# Patient Record
Sex: Female | Born: 1980
Health system: Southern US, Community
[De-identification: ages and names within clinical notes are randomized; demographics above are authoritative.]

## PROBLEM LIST (undated history)

## (undated) HISTORY — PX: LAPAROSCOPY: SHX197

## (undated) HISTORY — PX: DILATION AND CURETTAGE OF UTERUS: SHX78

---
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
## 2002-01-10 ENCOUNTER — Encounter: Payer: Self-pay | Admitting: Family Medicine

## 2002-01-10 ENCOUNTER — Encounter: Admission: RE | Admit: 2002-01-10 | Discharge: 2002-01-10 | Payer: Self-pay | Admitting: Family Medicine

## 2002-05-28 ENCOUNTER — Other Ambulatory Visit: Admission: RE | Admit: 2002-05-28 | Discharge: 2002-05-28 | Payer: Self-pay | Admitting: Obstetrics & Gynecology

## 2002-08-25 ENCOUNTER — Other Ambulatory Visit: Admission: RE | Admit: 2002-08-25 | Discharge: 2002-08-25 | Payer: Self-pay | Admitting: Obstetrics & Gynecology

## 2002-12-12 ENCOUNTER — Encounter (INDEPENDENT_AMBULATORY_CARE_PROVIDER_SITE_OTHER): Payer: Self-pay

## 2002-12-12 ENCOUNTER — Ambulatory Visit (HOSPITAL_COMMUNITY): Admission: RE | Admit: 2002-12-12 | Discharge: 2002-12-12 | Payer: Self-pay | Admitting: Obstetrics & Gynecology

## 2003-05-05 ENCOUNTER — Other Ambulatory Visit: Admission: RE | Admit: 2003-05-05 | Discharge: 2003-05-05 | Payer: Self-pay | Admitting: Obstetrics & Gynecology

## 2003-07-13 ENCOUNTER — Other Ambulatory Visit: Admission: RE | Admit: 2003-07-13 | Discharge: 2003-07-13 | Payer: Self-pay | Admitting: Obstetrics & Gynecology

## 2003-11-24 ENCOUNTER — Other Ambulatory Visit: Admission: RE | Admit: 2003-11-24 | Discharge: 2003-11-24 | Payer: Self-pay | Admitting: Obstetrics & Gynecology

## 2004-04-05 ENCOUNTER — Other Ambulatory Visit: Admission: RE | Admit: 2004-04-05 | Discharge: 2004-04-05 | Payer: Self-pay | Admitting: Obstetrics & Gynecology

## 2004-11-22 ENCOUNTER — Encounter: Admission: RE | Admit: 2004-11-22 | Discharge: 2004-11-22 | Payer: Self-pay | Admitting: Gastroenterology

## 2005-07-25 ENCOUNTER — Other Ambulatory Visit: Admission: RE | Admit: 2005-07-25 | Discharge: 2005-07-25 | Payer: Self-pay | Admitting: Obstetrics and Gynecology

## 2005-10-15 ENCOUNTER — Emergency Department (HOSPITAL_COMMUNITY): Admission: EM | Admit: 2005-10-15 | Discharge: 2005-10-15 | Payer: Self-pay | Admitting: Emergency Medicine

## 2006-12-07 ENCOUNTER — Other Ambulatory Visit: Admission: RE | Admit: 2006-12-07 | Discharge: 2006-12-07 | Payer: Self-pay | Admitting: Obstetrics and Gynecology

## 2007-05-05 ENCOUNTER — Emergency Department (HOSPITAL_COMMUNITY): Admission: EM | Admit: 2007-05-05 | Discharge: 2007-05-05 | Payer: Self-pay | Admitting: Emergency Medicine

## 2007-10-13 ENCOUNTER — Inpatient Hospital Stay (HOSPITAL_COMMUNITY): Admission: AD | Admit: 2007-10-13 | Discharge: 2007-10-13 | Payer: Self-pay | Admitting: Obstetrics and Gynecology

## 2007-12-04 ENCOUNTER — Inpatient Hospital Stay (HOSPITAL_COMMUNITY): Admission: AD | Admit: 2007-12-04 | Discharge: 2007-12-05 | Payer: Self-pay | Admitting: Obstetrics and Gynecology

## 2007-12-05 ENCOUNTER — Encounter (INDEPENDENT_AMBULATORY_CARE_PROVIDER_SITE_OTHER): Payer: Self-pay | Admitting: Obstetrics and Gynecology

## 2007-12-29 ENCOUNTER — Emergency Department (HOSPITAL_COMMUNITY): Admission: EM | Admit: 2007-12-29 | Discharge: 2007-12-29 | Payer: Self-pay | Admitting: Emergency Medicine

## 2008-02-03 ENCOUNTER — Encounter: Admission: RE | Admit: 2008-02-03 | Discharge: 2008-02-03 | Payer: Self-pay | Admitting: Otolaryngology

## 2008-02-17 ENCOUNTER — Other Ambulatory Visit: Admission: RE | Admit: 2008-02-17 | Discharge: 2008-02-17 | Payer: Self-pay | Admitting: Obstetrics and Gynecology

## 2008-06-09 ENCOUNTER — Inpatient Hospital Stay (HOSPITAL_COMMUNITY): Admission: AD | Admit: 2008-06-09 | Discharge: 2008-06-09 | Payer: Self-pay | Admitting: Obstetrics and Gynecology

## 2008-06-29 ENCOUNTER — Ambulatory Visit (HOSPITAL_COMMUNITY): Admission: RE | Admit: 2008-06-29 | Discharge: 2008-06-29 | Payer: Self-pay | Admitting: Obstetrics and Gynecology

## 2008-07-08 ENCOUNTER — Inpatient Hospital Stay (HOSPITAL_COMMUNITY): Admission: AD | Admit: 2008-07-08 | Discharge: 2008-07-09 | Payer: Self-pay | Admitting: Obstetrics and Gynecology

## 2008-07-11 ENCOUNTER — Inpatient Hospital Stay (HOSPITAL_COMMUNITY): Admission: AD | Admit: 2008-07-11 | Discharge: 2008-07-15 | Payer: Self-pay | Admitting: Obstetrics and Gynecology

## 2008-07-13 ENCOUNTER — Encounter (INDEPENDENT_AMBULATORY_CARE_PROVIDER_SITE_OTHER): Payer: Self-pay | Admitting: Obstetrics and Gynecology

## 2008-08-11 ENCOUNTER — Inpatient Hospital Stay (HOSPITAL_COMMUNITY): Admission: AD | Admit: 2008-08-11 | Discharge: 2008-08-11 | Payer: Self-pay | Admitting: Obstetrics and Gynecology

## 2008-08-13 ENCOUNTER — Encounter (INDEPENDENT_AMBULATORY_CARE_PROVIDER_SITE_OTHER): Payer: Self-pay | Admitting: Obstetrics and Gynecology

## 2008-08-13 ENCOUNTER — Ambulatory Visit (HOSPITAL_COMMUNITY): Admission: AD | Admit: 2008-08-13 | Discharge: 2008-08-13 | Payer: Self-pay | Admitting: Obstetrics and Gynecology

## 2009-01-14 ENCOUNTER — Emergency Department (HOSPITAL_COMMUNITY): Admission: EM | Admit: 2009-01-14 | Discharge: 2009-01-14 | Payer: Self-pay | Admitting: Family Medicine

## 2009-01-18 ENCOUNTER — Emergency Department (HOSPITAL_COMMUNITY): Admission: EM | Admit: 2009-01-18 | Discharge: 2009-01-18 | Payer: Self-pay | Admitting: Family Medicine

## 2009-03-27 ENCOUNTER — Emergency Department (HOSPITAL_COMMUNITY): Admission: EM | Admit: 2009-03-27 | Discharge: 2009-03-27 | Payer: Self-pay | Admitting: Emergency Medicine

## 2009-05-05 ENCOUNTER — Other Ambulatory Visit: Admission: RE | Admit: 2009-05-05 | Discharge: 2009-05-05 | Payer: Self-pay | Admitting: Obstetrics and Gynecology

## 2009-05-20 ENCOUNTER — Inpatient Hospital Stay (HOSPITAL_COMMUNITY): Admission: AD | Admit: 2009-05-20 | Discharge: 2009-05-20 | Payer: Self-pay | Admitting: Obstetrics and Gynecology

## 2009-06-03 ENCOUNTER — Ambulatory Visit (HOSPITAL_COMMUNITY): Admission: RE | Admit: 2009-06-03 | Discharge: 2009-06-03 | Payer: Self-pay | Admitting: Obstetrics and Gynecology

## 2009-06-14 ENCOUNTER — Ambulatory Visit: Payer: Self-pay | Admitting: Family

## 2009-06-14 ENCOUNTER — Inpatient Hospital Stay (HOSPITAL_COMMUNITY): Admission: AD | Admit: 2009-06-14 | Discharge: 2009-06-14 | Payer: Self-pay | Admitting: Obstetrics and Gynecology

## 2009-07-01 ENCOUNTER — Ambulatory Visit (HOSPITAL_COMMUNITY): Admission: RE | Admit: 2009-07-01 | Discharge: 2009-07-01 | Payer: Self-pay | Admitting: Obstetrics and Gynecology

## 2009-07-25 ENCOUNTER — Inpatient Hospital Stay (HOSPITAL_COMMUNITY): Admission: AD | Admit: 2009-07-25 | Discharge: 2009-07-25 | Payer: Self-pay | Admitting: Obstetrics and Gynecology

## 2009-07-25 ENCOUNTER — Ambulatory Visit: Payer: Self-pay | Admitting: Advanced Practice Midwife

## 2009-07-27 ENCOUNTER — Ambulatory Visit (HOSPITAL_COMMUNITY): Admission: RE | Admit: 2009-07-27 | Discharge: 2009-07-27 | Payer: Self-pay | Admitting: Obstetrics and Gynecology

## 2009-08-08 ENCOUNTER — Inpatient Hospital Stay (HOSPITAL_COMMUNITY): Admission: AD | Admit: 2009-08-08 | Discharge: 2009-08-08 | Payer: Self-pay | Admitting: Obstetrics and Gynecology

## 2009-08-09 ENCOUNTER — Ambulatory Visit (HOSPITAL_COMMUNITY): Admission: RE | Admit: 2009-08-09 | Discharge: 2009-08-09 | Payer: Self-pay | Admitting: Obstetrics and Gynecology

## 2009-08-23 ENCOUNTER — Ambulatory Visit (HOSPITAL_COMMUNITY): Admission: RE | Admit: 2009-08-23 | Discharge: 2009-08-23 | Payer: Self-pay | Admitting: Obstetrics and Gynecology

## 2009-09-02 ENCOUNTER — Inpatient Hospital Stay (HOSPITAL_COMMUNITY): Admission: AD | Admit: 2009-09-02 | Discharge: 2009-09-03 | Payer: Self-pay | Admitting: Obstetrics and Gynecology

## 2009-09-02 ENCOUNTER — Ambulatory Visit: Payer: Self-pay | Admitting: Family Medicine

## 2009-09-06 ENCOUNTER — Ambulatory Visit (HOSPITAL_COMMUNITY): Admission: RE | Admit: 2009-09-06 | Discharge: 2009-09-06 | Payer: Self-pay | Admitting: Obstetrics and Gynecology

## 2009-09-10 ENCOUNTER — Inpatient Hospital Stay (HOSPITAL_COMMUNITY): Admission: AD | Admit: 2009-09-10 | Discharge: 2009-09-10 | Payer: Self-pay | Admitting: Obstetrics and Gynecology

## 2009-09-11 ENCOUNTER — Inpatient Hospital Stay (HOSPITAL_COMMUNITY): Admission: AD | Admit: 2009-09-11 | Discharge: 2009-09-17 | Payer: Self-pay | Admitting: Obstetrics and Gynecology

## 2009-09-15 ENCOUNTER — Encounter: Payer: Self-pay | Admitting: Obstetrics and Gynecology

## 2009-09-20 ENCOUNTER — Ambulatory Visit (HOSPITAL_COMMUNITY): Admission: RE | Admit: 2009-09-20 | Discharge: 2009-09-20 | Payer: Self-pay | Admitting: Obstetrics and Gynecology

## 2009-09-22 ENCOUNTER — Ambulatory Visit (HOSPITAL_COMMUNITY): Admission: RE | Admit: 2009-09-22 | Discharge: 2009-09-22 | Payer: Self-pay | Admitting: Obstetrics and Gynecology

## 2009-09-29 ENCOUNTER — Ambulatory Visit (HOSPITAL_COMMUNITY): Admission: RE | Admit: 2009-09-29 | Discharge: 2009-09-29 | Payer: Self-pay | Admitting: Obstetrics and Gynecology

## 2009-10-04 ENCOUNTER — Ambulatory Visit (HOSPITAL_COMMUNITY): Admission: RE | Admit: 2009-10-04 | Discharge: 2009-10-04 | Payer: Self-pay | Admitting: Obstetrics and Gynecology

## 2009-10-06 ENCOUNTER — Ambulatory Visit (HOSPITAL_COMMUNITY): Admission: RE | Admit: 2009-10-06 | Discharge: 2009-10-06 | Payer: Self-pay | Admitting: Obstetrics and Gynecology

## 2009-10-13 ENCOUNTER — Inpatient Hospital Stay (HOSPITAL_COMMUNITY): Admission: AD | Admit: 2009-10-13 | Discharge: 2009-10-13 | Payer: Self-pay | Admitting: Obstetrics and Gynecology

## 2009-10-18 ENCOUNTER — Ambulatory Visit (HOSPITAL_COMMUNITY): Admission: RE | Admit: 2009-10-18 | Discharge: 2009-10-18 | Payer: Self-pay | Admitting: Obstetrics and Gynecology

## 2009-10-20 ENCOUNTER — Inpatient Hospital Stay (HOSPITAL_COMMUNITY): Admission: AD | Admit: 2009-10-20 | Discharge: 2009-10-20 | Payer: Self-pay | Admitting: Obstetrics and Gynecology

## 2009-10-27 ENCOUNTER — Ambulatory Visit (HOSPITAL_COMMUNITY): Admission: RE | Admit: 2009-10-27 | Discharge: 2009-10-27 | Payer: Self-pay | Admitting: Obstetrics and Gynecology

## 2009-11-01 ENCOUNTER — Ambulatory Visit (HOSPITAL_COMMUNITY): Admission: RE | Admit: 2009-11-01 | Discharge: 2009-11-01 | Payer: Self-pay | Admitting: Obstetrics and Gynecology

## 2009-11-03 ENCOUNTER — Inpatient Hospital Stay (HOSPITAL_COMMUNITY): Admission: AD | Admit: 2009-11-03 | Discharge: 2009-11-03 | Payer: Self-pay | Admitting: Obstetrics and Gynecology

## 2009-11-10 ENCOUNTER — Ambulatory Visit (HOSPITAL_COMMUNITY): Admission: AD | Admit: 2009-11-10 | Discharge: 2009-11-10 | Payer: Self-pay | Admitting: Obstetrics and Gynecology

## 2009-11-17 ENCOUNTER — Ambulatory Visit (HOSPITAL_COMMUNITY): Admission: RE | Admit: 2009-11-17 | Discharge: 2009-11-17 | Payer: Self-pay | Admitting: Obstetrics and Gynecology

## 2009-11-22 ENCOUNTER — Ambulatory Visit (HOSPITAL_COMMUNITY): Admission: AD | Admit: 2009-11-22 | Discharge: 2009-11-22 | Payer: Self-pay | Admitting: Obstetrics and Gynecology

## 2009-11-22 ENCOUNTER — Ambulatory Visit: Payer: Self-pay | Admitting: Obstetrics and Gynecology

## 2009-11-24 ENCOUNTER — Ambulatory Visit (HOSPITAL_COMMUNITY): Admission: RE | Admit: 2009-11-24 | Discharge: 2009-11-24 | Payer: Self-pay | Admitting: Obstetrics and Gynecology

## 2009-12-01 ENCOUNTER — Ambulatory Visit (HOSPITAL_COMMUNITY): Admission: RE | Admit: 2009-12-01 | Discharge: 2009-12-01 | Payer: Self-pay | Admitting: Obstetrics and Gynecology

## 2009-12-19 ENCOUNTER — Inpatient Hospital Stay (HOSPITAL_COMMUNITY): Admission: AD | Admit: 2009-12-19 | Discharge: 2009-12-19 | Payer: Self-pay | Admitting: Obstetrics and Gynecology

## 2010-01-03 ENCOUNTER — Inpatient Hospital Stay (HOSPITAL_COMMUNITY): Admission: RE | Admit: 2010-01-03 | Discharge: 2010-01-06 | Payer: Self-pay | Admitting: Obstetrics and Gynecology

## 2010-01-03 ENCOUNTER — Encounter (INDEPENDENT_AMBULATORY_CARE_PROVIDER_SITE_OTHER): Payer: Self-pay | Admitting: Obstetrics and Gynecology

## 2010-03-20 IMAGING — US US OB COMP LESS 14 WK
1 series · 12 of 12 positions shown · non-contrast
Comparison: none

OBSTETRICAL ULTRASOUND:
 This ultrasound was performed in The [HOSPITAL], and the [facility] report will be stored to [REDACTED] PACS.

[Series 1: us ob comp less 14 wk · 12 of 12 slices shown]
[im 1/12]
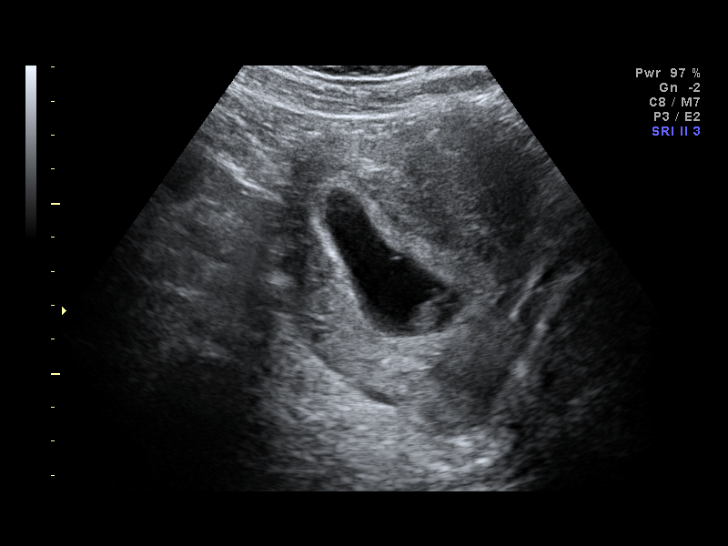
[im 2/12]
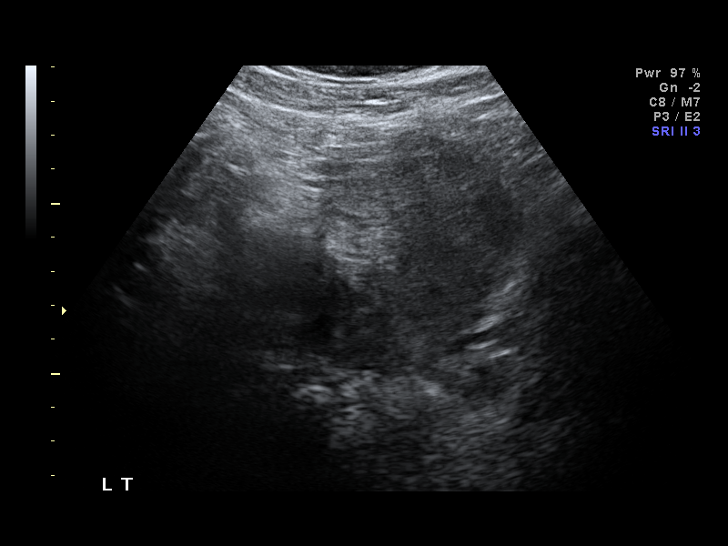
[im 3/12]
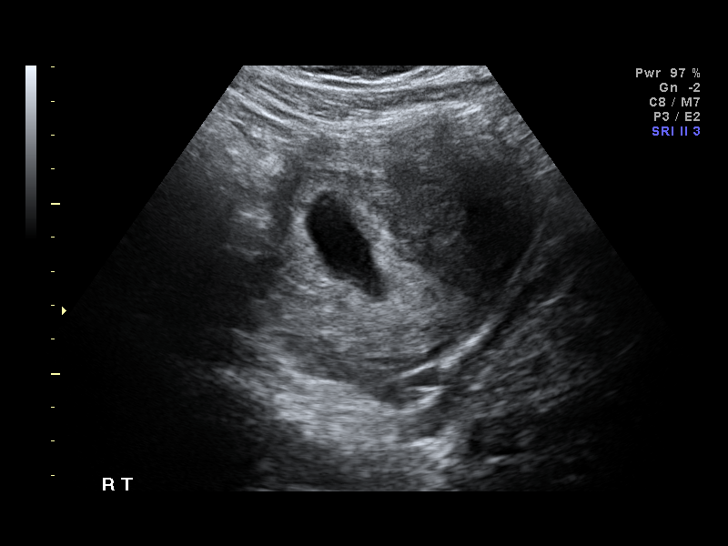
[im 4/12]
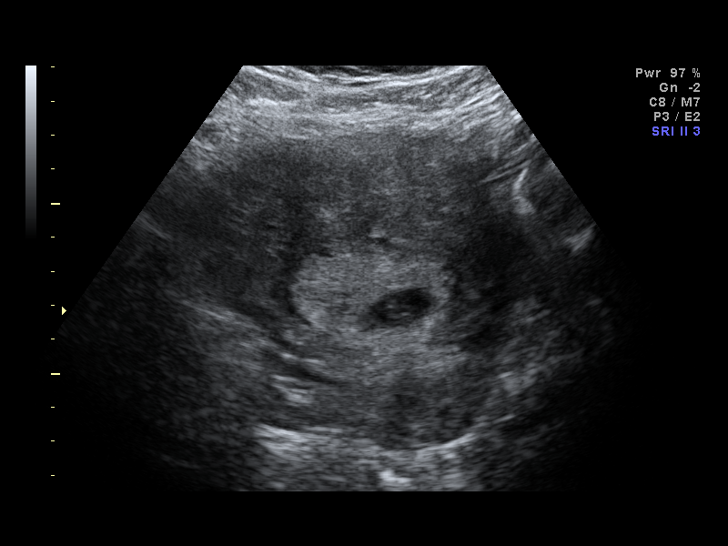
[im 5/12]
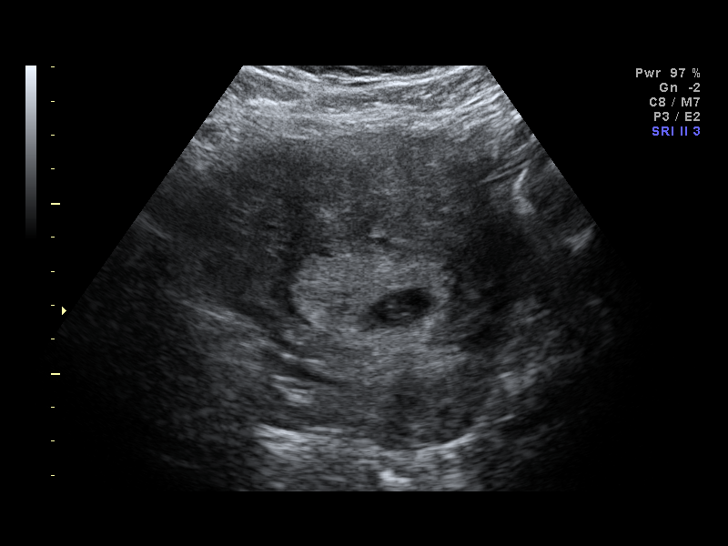
[im 6/12]
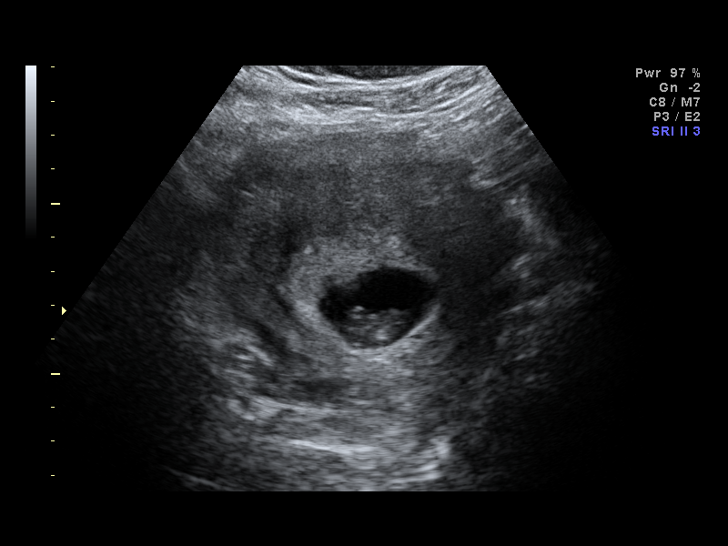
[im 7/12]
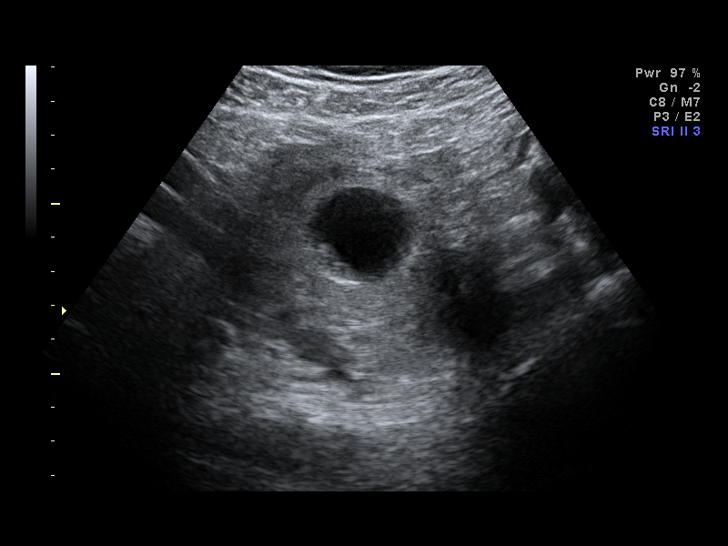
[im 8/12]
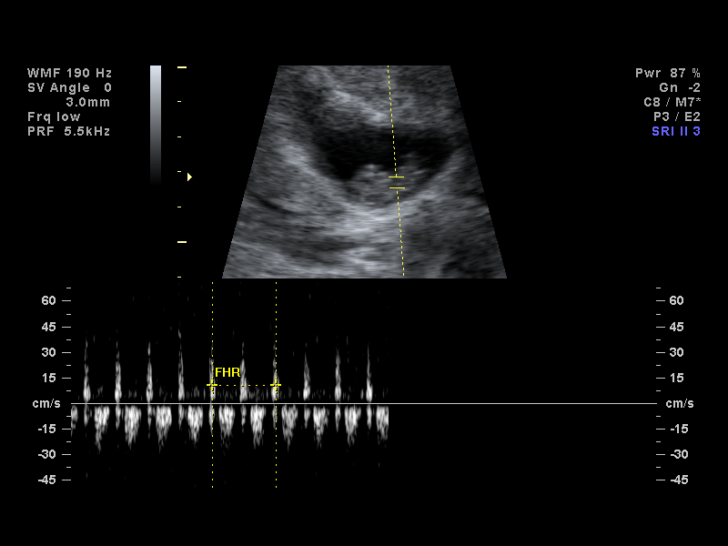
[im 9/12]
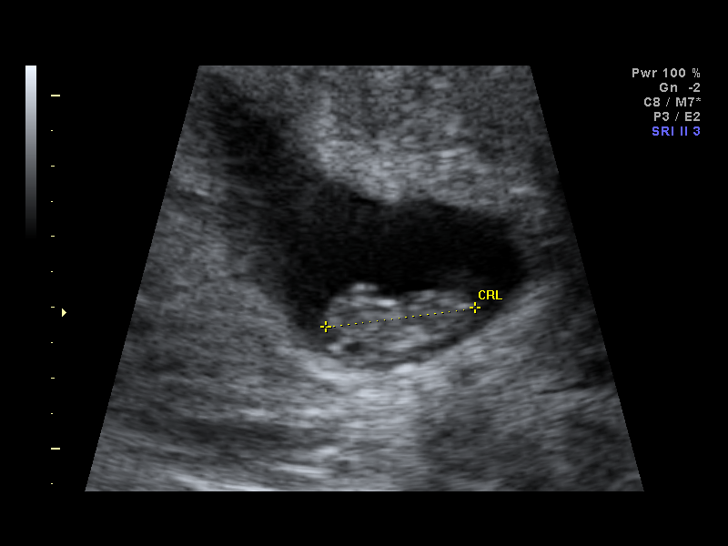
[im 10/12]
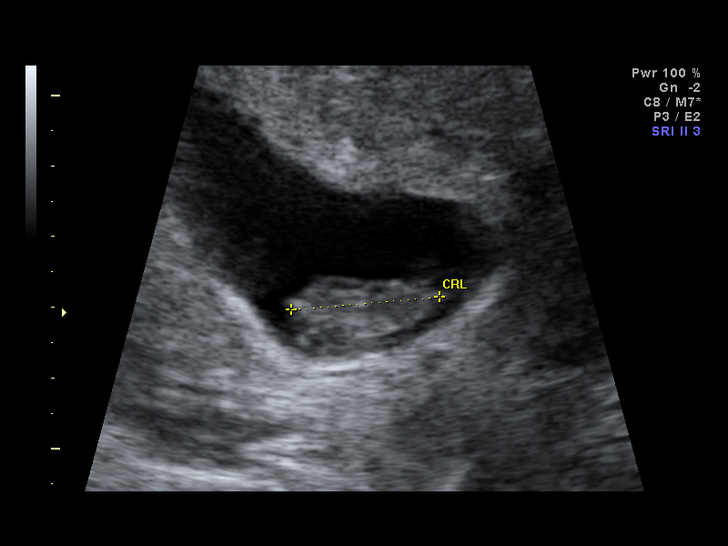
[im 11/12]
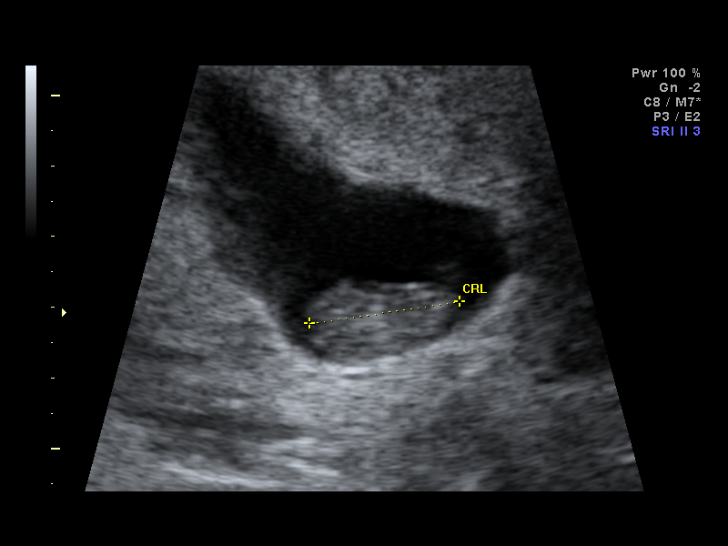
[im 12/12]
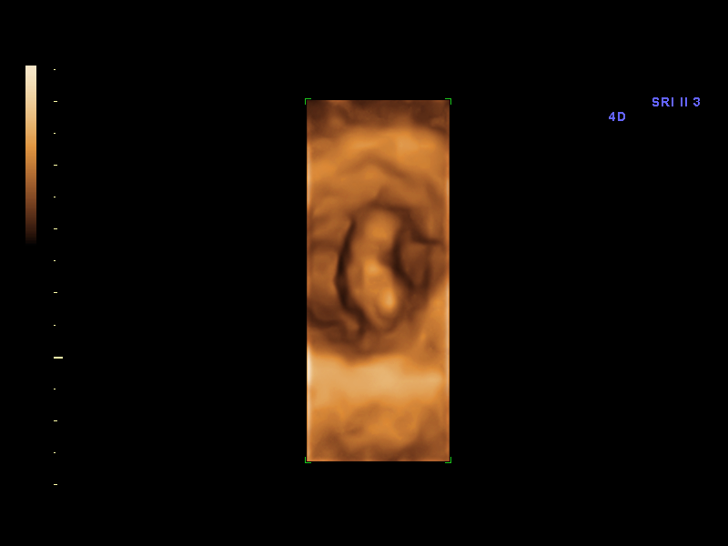

[12 of 12 positions shown; findings below may reference images not displayed]

IMPRESSION: [facility] has also been faxed to the ordering physician.

## 2010-04-17 IMAGING — US US OB COMP LESS 14 WK
1 series · 14 of 28 positions shown · non-contrast
Comparison: none

OBSTETRICAL ULTRASOUND:
 This ultrasound was performed in The [HOSPITAL], and the [facility] report will be stored to [REDACTED] PACS.

[Series 1: us ob comp less 14 wk · 14 of 32 slices shown]
[im 2/32]
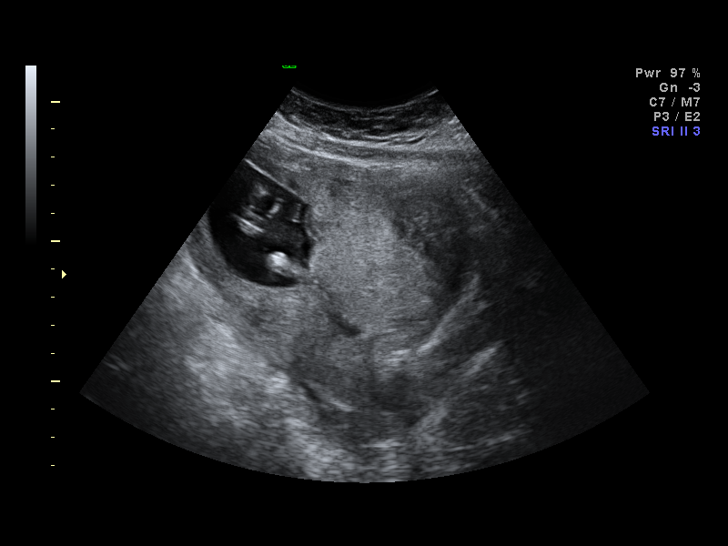
[im 4/32]
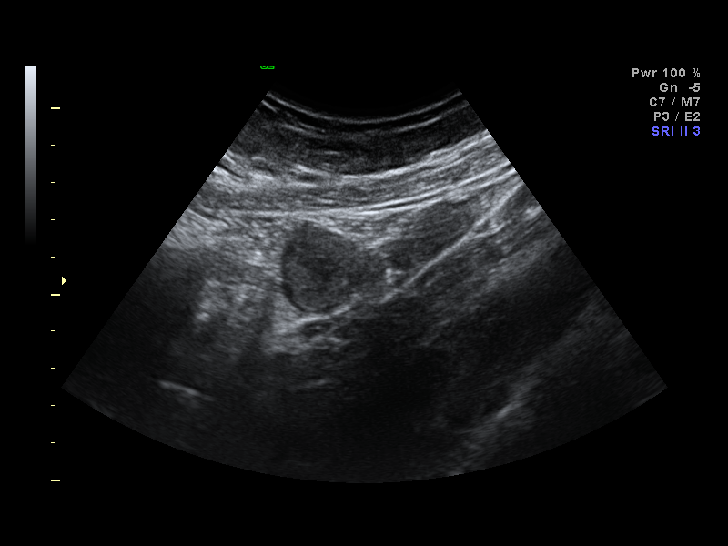
[im 6/32]
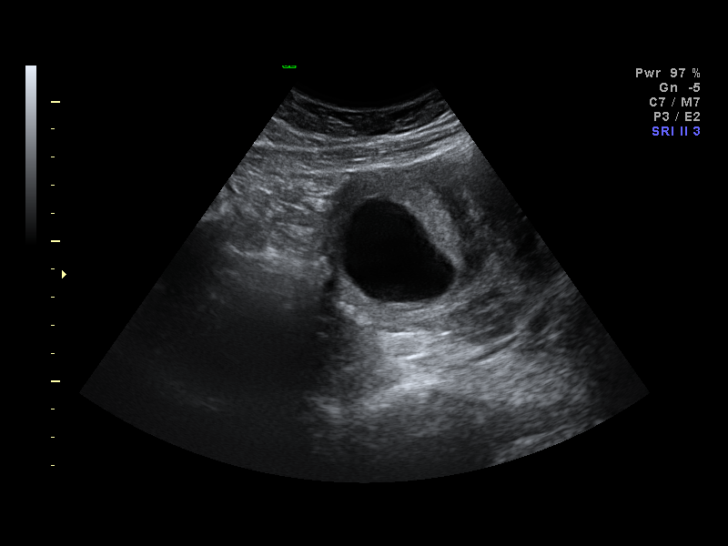
[im 9/32]
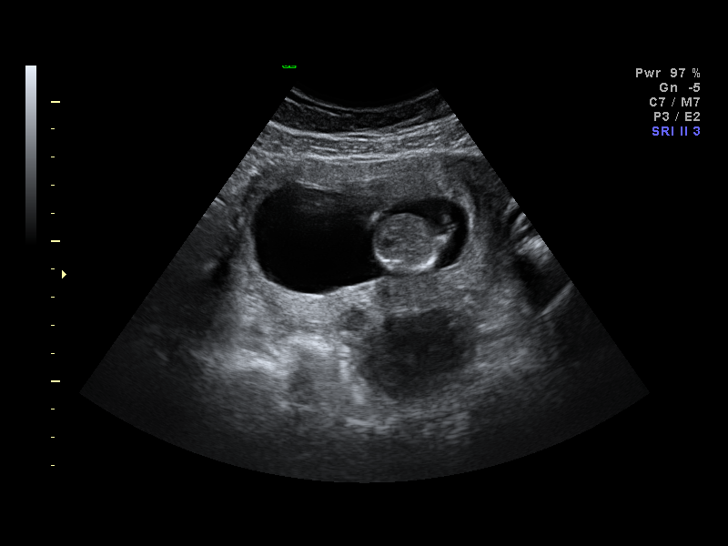
[im 11/32]
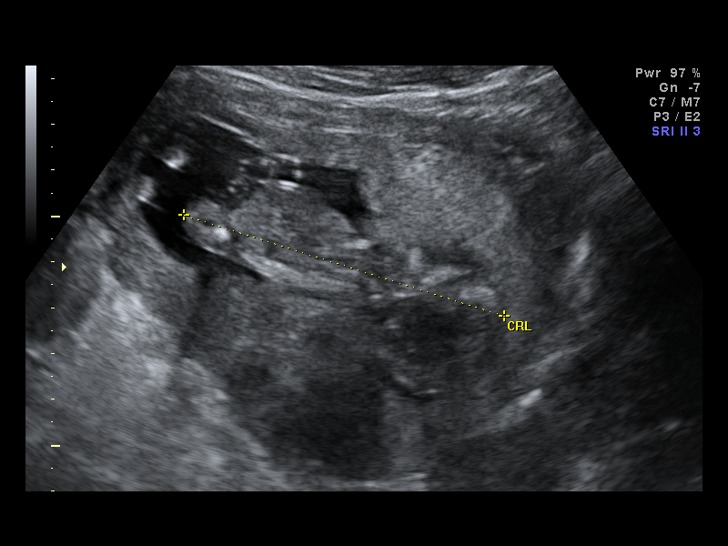
[im 13/32]
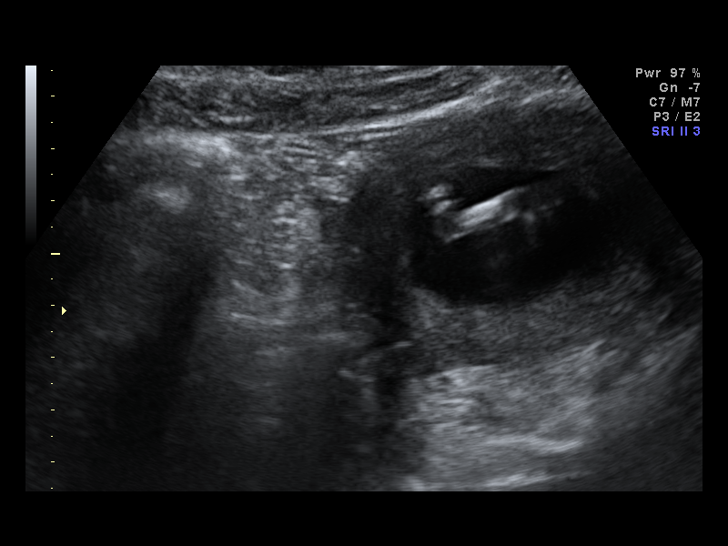
[im 15/32]
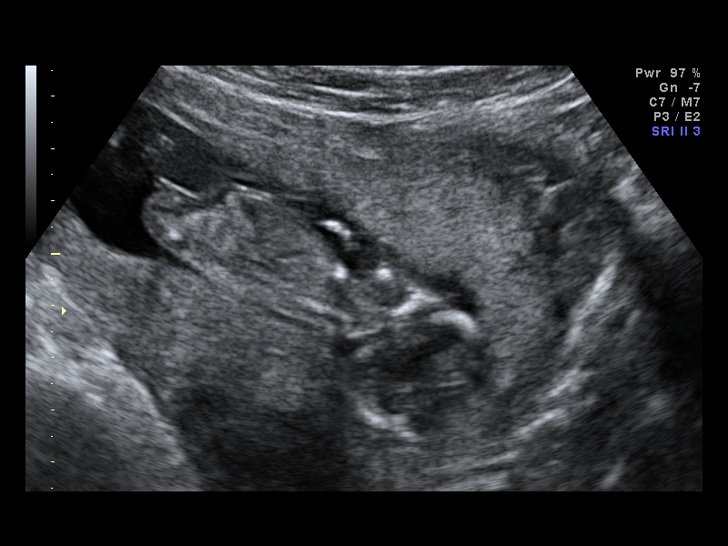
[im 18/32]
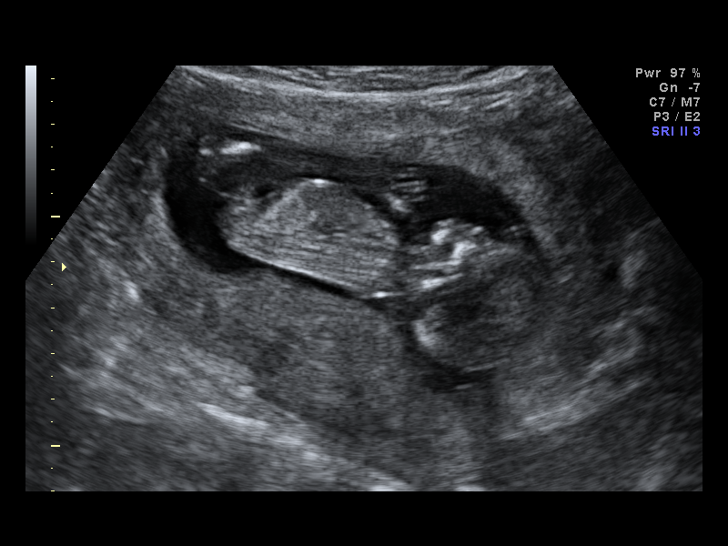
[im 20/32]
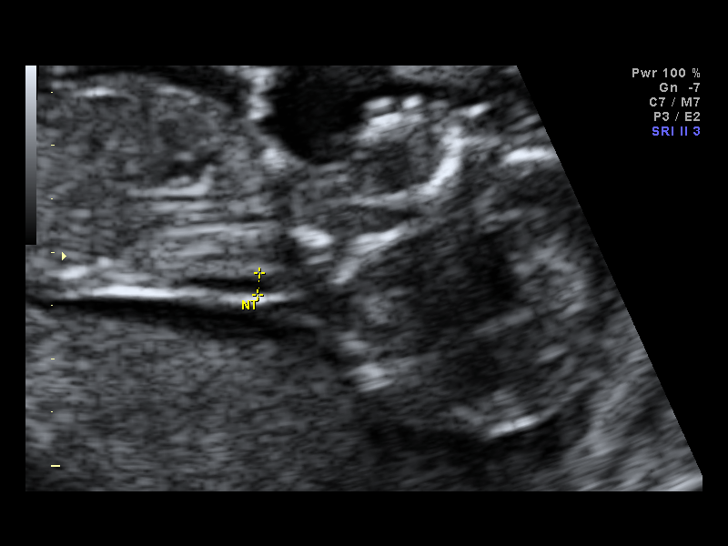
[im 22/32]
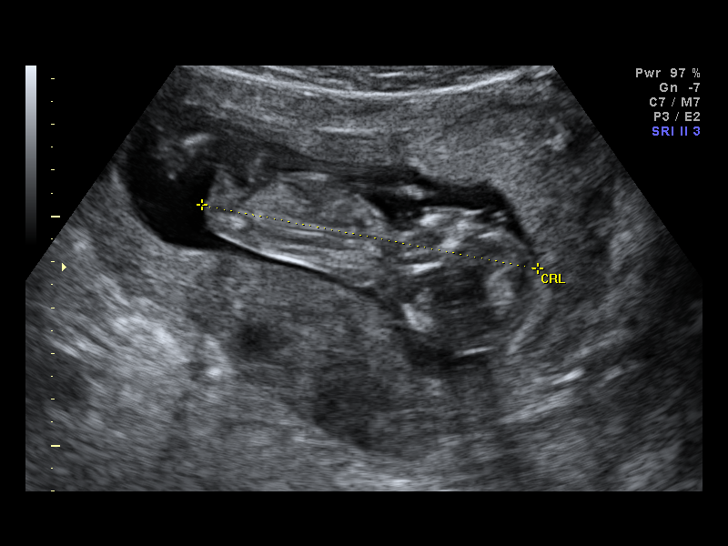
[im 25/32]
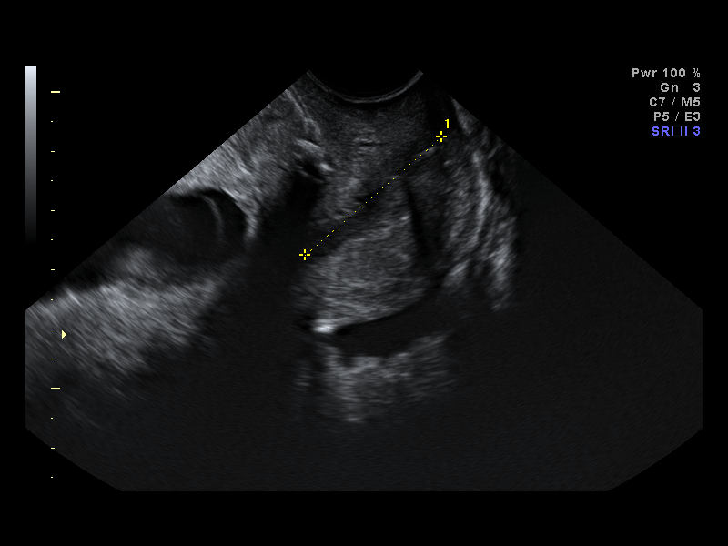
[im 27/32]
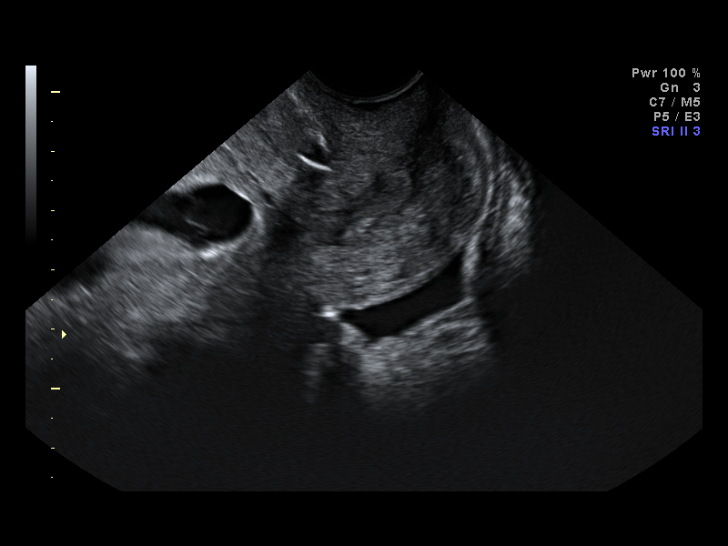
[im 29/32]
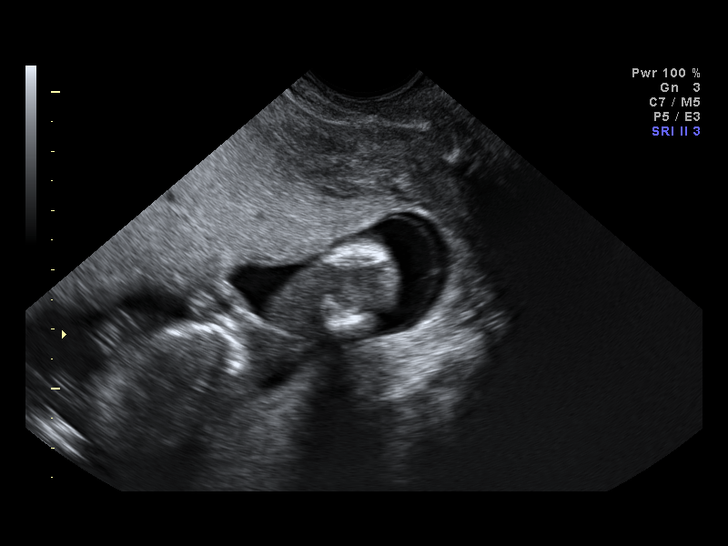
[im 32/32]
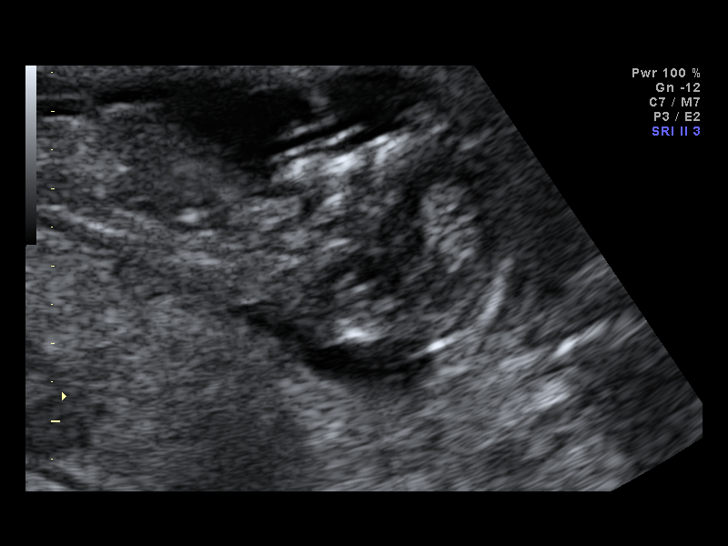

[14 of 28 positions shown; findings below may reference images not displayed]

IMPRESSION: [facility] has also been faxed to the ordering physician.

## 2010-08-04 IMAGING — US US OB TRANSVAGINAL
1 series · 15 of 15 positions shown · non-contrast
Comparison: none

OBSTETRICAL ULTRASOUND:
 This ultrasound was performed in The [[facility]OSP[facility]], and the [facility] report will be stored to [REDACTED] PACS.  This report is also available in [HOSPITAL]?s accessANYware.

[Series 1: us ob transvaginal · 15 of 15 slices shown]
[im 1/15]
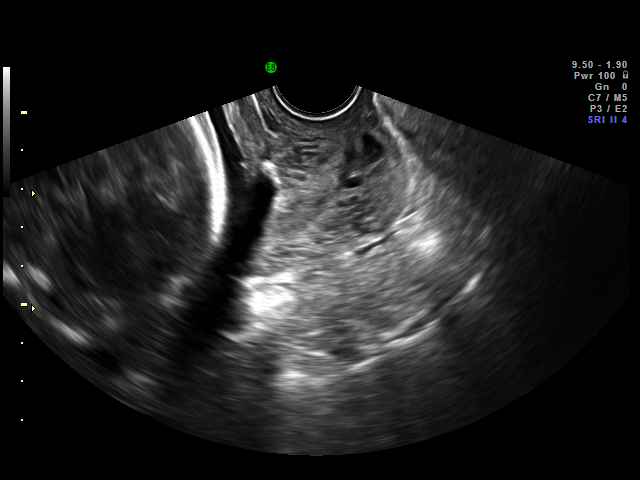
[im 2/15]
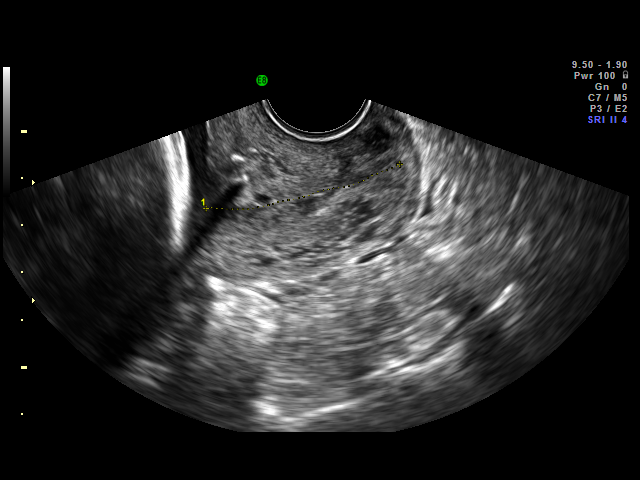
[im 3/15]
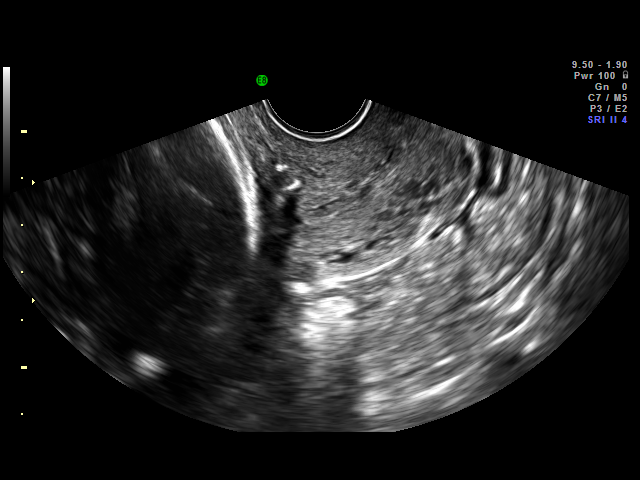
[im 4/15]
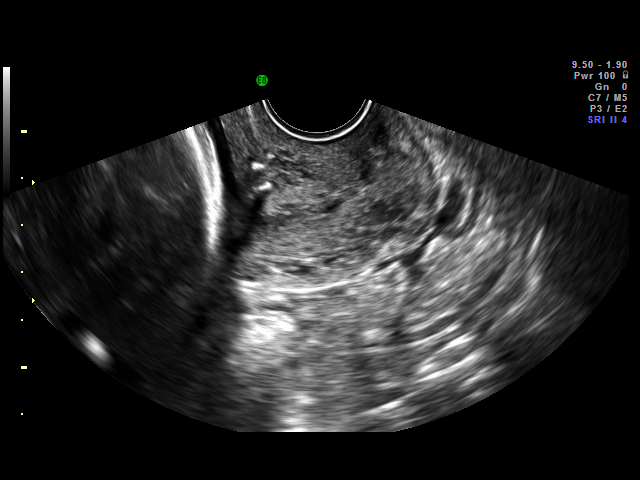
[im 5/15]
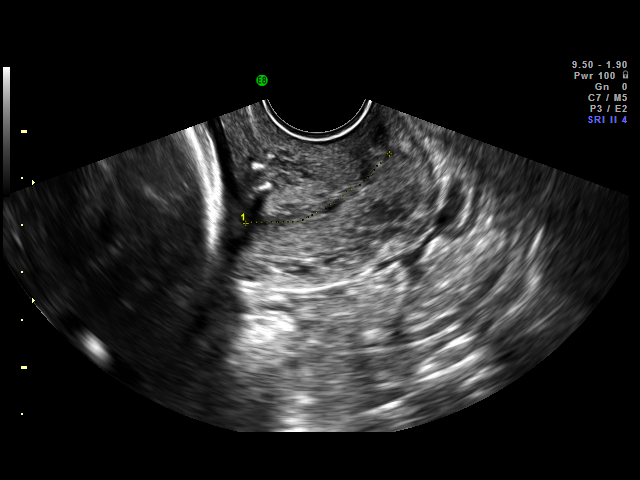
[im 6/15]
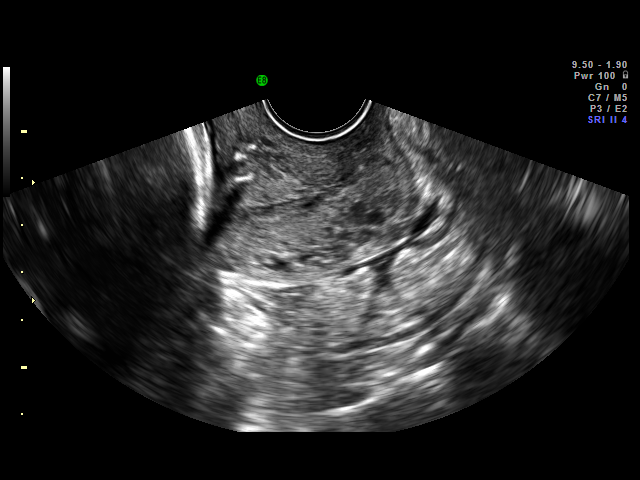
[im 7/15]
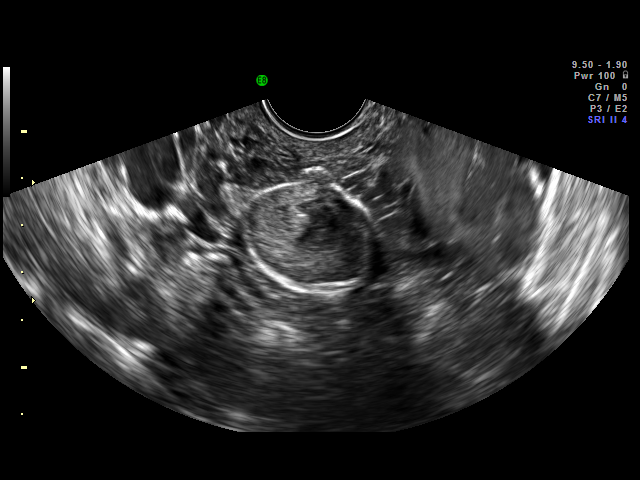
[im 8/15]
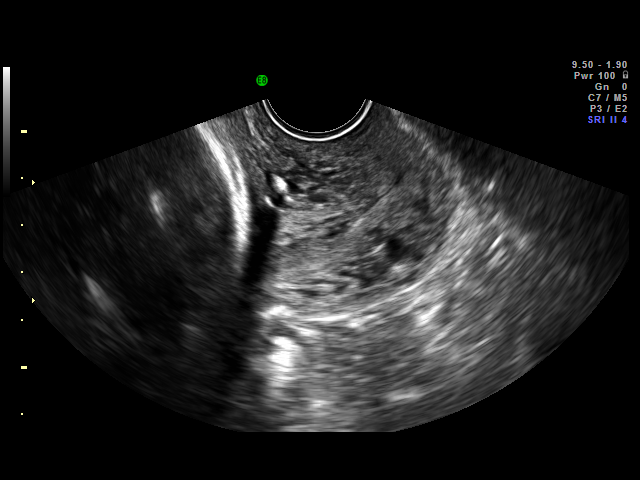
[im 9/15]
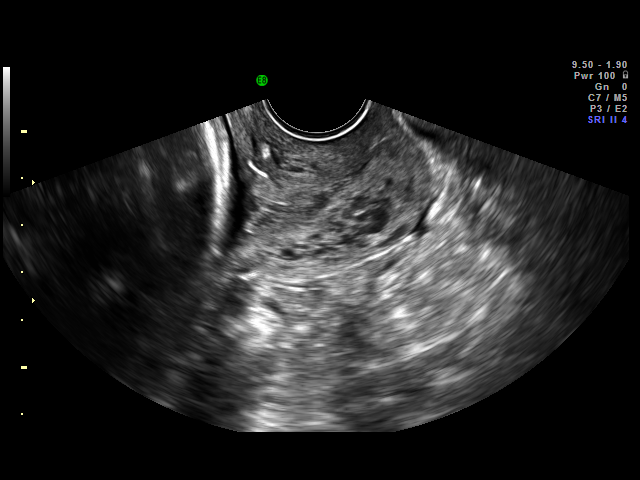
[im 10/15]
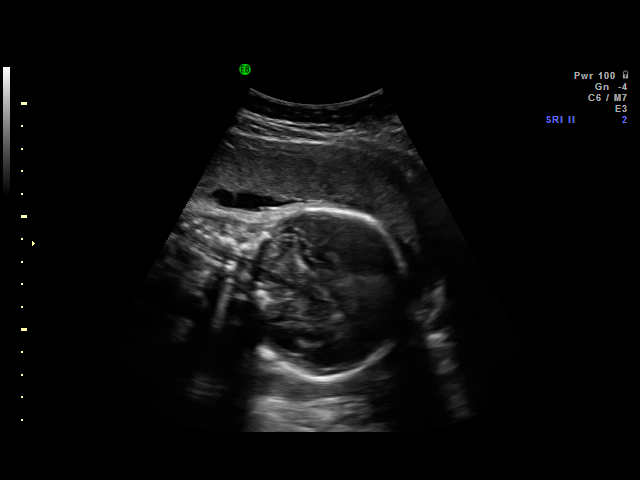
[im 11/15]
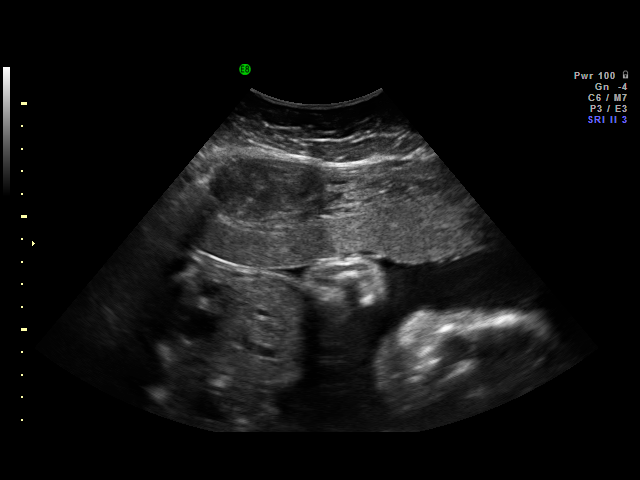
[im 12/15]
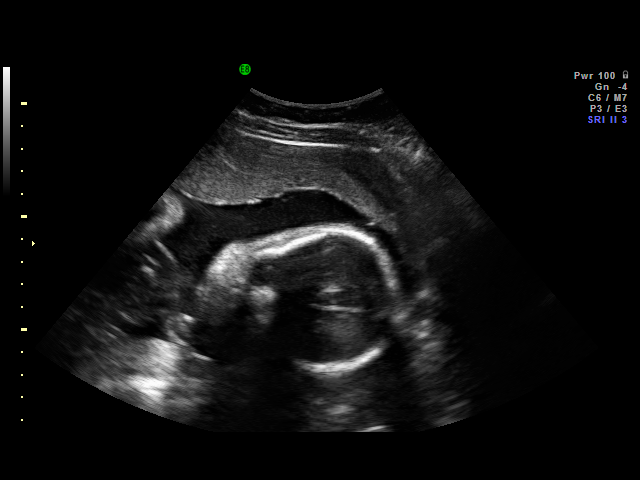
[im 13/15]
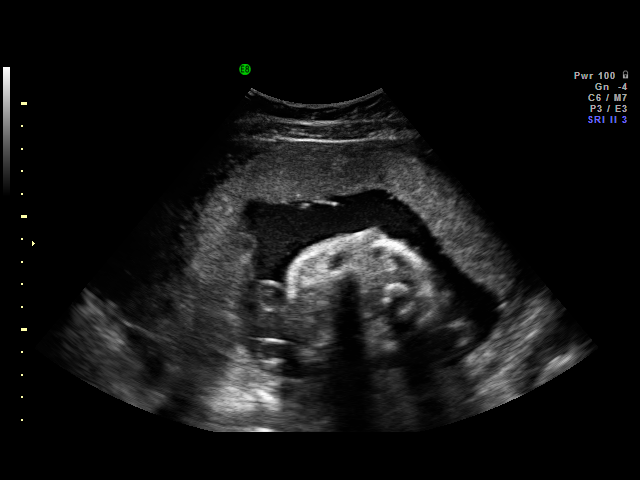
[im 14/15]
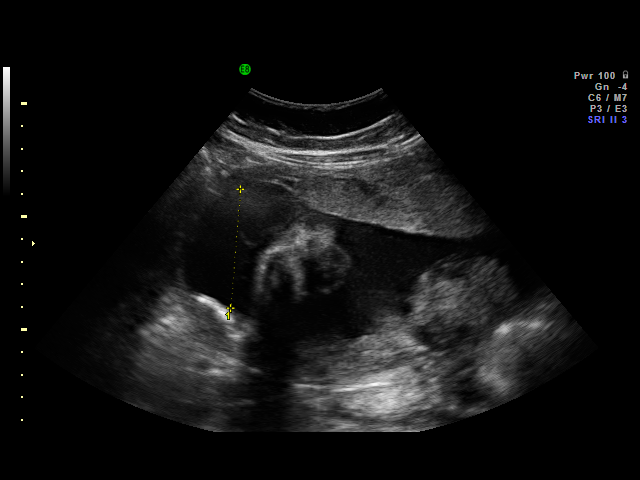
[im 15/15]
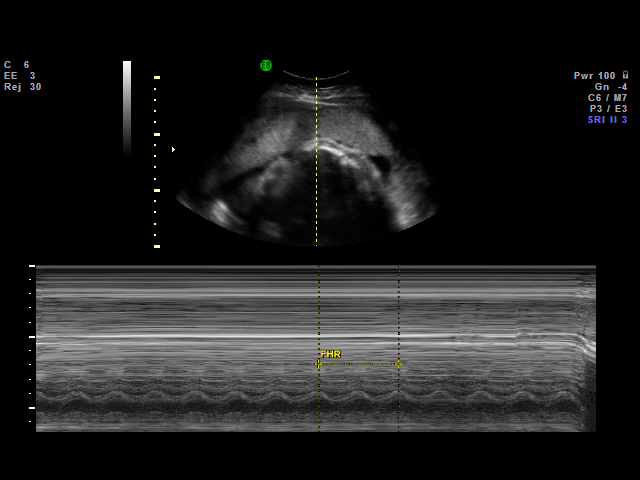

[15 of 15 positions shown; findings below may reference images not displayed]

IMPRESSION: [facility] has also been faxed to the ordering physician.

## 2010-11-20 ENCOUNTER — Encounter: Payer: Self-pay | Admitting: Obstetrics and Gynecology

## 2010-11-20 ENCOUNTER — Encounter: Payer: Self-pay | Admitting: Otolaryngology

## 2010-11-21 ENCOUNTER — Encounter: Payer: Self-pay | Admitting: Obstetrics and Gynecology

## 2011-01-22 LAB — CBC
HCT: 28.1 % — ABNORMAL LOW (ref 36.0–46.0)
HCT: 37.9 % (ref 36.0–46.0)
Hemoglobin: 12.3 g/dL (ref 12.0–15.0)
Hemoglobin: 9.2 g/dL — ABNORMAL LOW (ref 12.0–15.0)
MCHC: 32.5 g/dL (ref 30.0–36.0)
MCHC: 32.7 g/dL (ref 30.0–36.0)
MCV: 86.1 fL (ref 78.0–100.0)
MCV: 87.5 fL (ref 78.0–100.0)
Platelets: 186 10*3/uL (ref 150–400)
Platelets: 223 10*3/uL (ref 150–400)
RBC: 3.21 MIL/uL — ABNORMAL LOW (ref 3.87–5.11)
RBC: 4.4 MIL/uL (ref 3.87–5.11)
RDW: 15.2 % (ref 11.5–15.5)
RDW: 15.6 % — ABNORMAL HIGH (ref 11.5–15.5)
WBC: 11.4 10*3/uL — ABNORMAL HIGH (ref 4.0–10.5)
WBC: 13.6 10*3/uL — ABNORMAL HIGH (ref 4.0–10.5)

## 2011-01-22 LAB — TYPE AND SCREEN
ABO/RH(D): O POS
Antibody Screen: NEGATIVE

## 2011-01-22 LAB — RPR: RPR Ser Ql: NONREACTIVE

## 2011-02-01 LAB — CBC
HCT: 32.9 % — ABNORMAL LOW (ref 36.0–46.0)
HCT: 33.5 % — ABNORMAL LOW (ref 36.0–46.0)
Hemoglobin: 11 g/dL — ABNORMAL LOW (ref 12.0–15.0)
Hemoglobin: 11.1 g/dL — ABNORMAL LOW (ref 12.0–15.0)
MCHC: 33.3 g/dL (ref 30.0–36.0)
MCHC: 33.3 g/dL (ref 30.0–36.0)
MCV: 84.2 fL (ref 78.0–100.0)
MCV: 84.4 fL (ref 78.0–100.0)
Platelets: 268 10*3/uL (ref 150–400)
Platelets: 282 10*3/uL (ref 150–400)
RBC: 3.91 MIL/uL (ref 3.87–5.11)
RBC: 3.96 MIL/uL (ref 3.87–5.11)
RDW: 14.9 % (ref 11.5–15.5)
RDW: 15.1 % (ref 11.5–15.5)
WBC: 13.3 10*3/uL — ABNORMAL HIGH (ref 4.0–10.5)
WBC: 14.8 10*3/uL — ABNORMAL HIGH (ref 4.0–10.5)

## 2011-02-01 LAB — URINE MICROSCOPIC-ADD ON: WBC, UA: NONE SEEN WBC/hpf (ref <3)

## 2011-02-01 LAB — URINALYSIS, MICROSCOPIC ONLY
Bilirubin Urine: NEGATIVE
Glucose, UA: NEGATIVE mg/dL
Ketones, ur: NEGATIVE mg/dL
Leukocytes, UA: NEGATIVE
Nitrite: NEGATIVE
Protein, ur: NEGATIVE mg/dL
Specific Gravity, Urine: <1.005 — ABNORMAL LOW (ref 1.005–1.030)
Urobilinogen, UA: 0.2 mg/dL (ref 0.0–1.0)
pH: 7 (ref 5.0–8.0)

## 2011-02-01 LAB — URINALYSIS, ROUTINE W REFLEX MICROSCOPIC
Bilirubin Urine: NEGATIVE
Bilirubin Urine: NEGATIVE
Glucose, UA: NEGATIVE mg/dL
Glucose, UA: NEGATIVE mg/dL
Ketones, ur: 15 mg/dL — AB
Ketones, ur: NEGATIVE mg/dL
Leukocytes, UA: NEGATIVE
Leukocytes, UA: NEGATIVE
Nitrite: NEGATIVE
Nitrite: NEGATIVE
Protein, ur: NEGATIVE mg/dL
Protein, ur: NEGATIVE mg/dL
Specific Gravity, Urine: 1.01 (ref 1.005–1.030)
Specific Gravity, Urine: <1.005 — ABNORMAL LOW (ref 1.005–1.030)
Urobilinogen, UA: 0.2 mg/dL (ref 0.0–1.0)
Urobilinogen, UA: 0.2 mg/dL (ref 0.0–1.0)
pH: 5.5 (ref 5.0–8.0)
pH: 6 (ref 5.0–8.0)

## 2011-02-01 LAB — COMPREHENSIVE METABOLIC PANEL
ALT: 24 U/L (ref 0–35)
AST: 34 U/L (ref 0–37)
Albumin: 2.9 g/dL — ABNORMAL LOW (ref 3.5–5.2)
Alkaline Phosphatase: 59 U/L (ref 39–117)
Calcium: 9.1 mg/dL (ref 8.4–10.5)
GFR calc Af Amer: >60 mL/min (ref >60)
Potassium: 3.4 mEq/L — ABNORMAL LOW (ref 3.5–5.1)
Sodium: 136 mEq/L (ref 135–145)
Total Protein: 5.9 g/dL — ABNORMAL LOW (ref 6.0–8.3)

## 2011-02-01 LAB — WET PREP, GENITAL
Clue Cells Wet Prep HPF POC: NONE SEEN
Trich, Wet Prep: NONE SEEN
Trich, Wet Prep: NONE SEEN
Yeast Wet Prep HPF POC: NONE SEEN
Yeast Wet Prep HPF POC: NONE SEEN

## 2011-02-01 LAB — GC/CHLAMYDIA PROBE AMP, URINE
Chlamydia, Swab/Urine, PCR: NEGATIVE
GC Probe Amp, Urine: NEGATIVE

## 2011-02-01 LAB — COMPREHENSIVE METABOLIC PANEL WITH GFR
BUN: 2 mg/dL — ABNORMAL LOW (ref 6–23)
CO2: 20 meq/L (ref 19–32)
Chloride: 106 meq/L (ref 96–112)
Creatinine, Ser: 0.58 mg/dL (ref 0.4–1.2)
GFR calc non Af Amer: >60 mL/min (ref >60)
Glucose, Bld: 79 mg/dL (ref 70–99)
Total Bilirubin: 0.4 mg/dL (ref 0.3–1.2)

## 2011-02-01 LAB — RPR: RPR Ser Ql: NONREACTIVE

## 2011-02-04 LAB — URINE MICROSCOPIC-ADD ON

## 2011-02-04 LAB — URINALYSIS, ROUTINE W REFLEX MICROSCOPIC
Bilirubin Urine: NEGATIVE
Glucose, UA: NEGATIVE mg/dL
Ketones, ur: >80 mg/dL — AB
Leukocytes, UA: NEGATIVE
Nitrite: NEGATIVE
Protein, ur: NEGATIVE mg/dL
Specific Gravity, Urine: >1.03 — ABNORMAL HIGH (ref 1.005–1.030)
Urobilinogen, UA: 0.2 mg/dL (ref 0.0–1.0)
pH: 5.5 (ref 5.0–8.0)

## 2011-02-04 LAB — WET PREP, GENITAL
Clue Cells Wet Prep HPF POC: NONE SEEN
Trich, Wet Prep: NONE SEEN
Yeast Wet Prep HPF POC: NONE SEEN

## 2011-02-05 LAB — CBC
HCT: 35.2 % — ABNORMAL LOW (ref 36.0–46.0)
Hemoglobin: 12 g/dL (ref 12.0–15.0)
MCHC: 34 g/dL (ref 30.0–36.0)
MCV: 83.5 fL (ref 78.0–100.0)
Platelets: 319 10*3/uL (ref 150–400)
RBC: 4.21 MIL/uL (ref 3.87–5.11)
RDW: 13.9 % (ref 11.5–15.5)
WBC: 10.1 10*3/uL (ref 4.0–10.5)

## 2011-02-05 LAB — ABO/RH: ABO/RH(D): O POS

## 2011-02-05 LAB — POCT PREGNANCY, URINE: Preg Test, Ur: POSITIVE

## 2011-02-05 LAB — HCG, QUANTITATIVE, PREGNANCY: hCG, Beta Chain, Quant, S: 66077 m[IU]/mL — ABNORMAL HIGH (ref <5)

## 2011-02-07 LAB — POCT URINALYSIS DIP (DEVICE)
Bilirubin Urine: NEGATIVE
Glucose, UA: NEGATIVE mg/dL
Nitrite: NEGATIVE
Protein, ur: NEGATIVE mg/dL
Specific Gravity, Urine: 1.025 (ref 1.005–1.030)
Urobilinogen, UA: 0.2 mg/dL (ref 0.0–1.0)
pH: 7 (ref 5.0–8.0)

## 2011-02-07 LAB — GC/CHLAMYDIA PROBE AMP, GENITAL
Chlamydia, DNA Probe: NEGATIVE
GC Probe Amp, Genital: NEGATIVE

## 2011-02-07 LAB — WET PREP, GENITAL
Trich, Wet Prep: NONE SEEN
Yeast Wet Prep HPF POC: NONE SEEN

## 2011-02-07 LAB — POCT PREGNANCY, URINE: Preg Test, Ur: NEGATIVE

## 2011-02-09 LAB — POCT URINALYSIS DIP (DEVICE)
Bilirubin Urine: NEGATIVE
Bilirubin Urine: NEGATIVE
Glucose, UA: NEGATIVE mg/dL
Glucose, UA: NEGATIVE mg/dL
Ketones, ur: NEGATIVE mg/dL
Ketones, ur: NEGATIVE mg/dL
Nitrite: NEGATIVE
Nitrite: NEGATIVE
Protein, ur: NEGATIVE mg/dL
Protein, ur: NEGATIVE mg/dL
Specific Gravity, Urine: 1.02 (ref 1.005–1.030)
Specific Gravity, Urine: 1.025 (ref 1.005–1.030)
Urobilinogen, UA: 0.2 mg/dL (ref 0.0–1.0)
Urobilinogen, UA: 0.2 mg/dL (ref 0.0–1.0)
pH: 6 (ref 5.0–8.0)
pH: 6.5 (ref 5.0–8.0)

## 2011-02-09 LAB — POCT PREGNANCY, URINE
Preg Test, Ur: NEGATIVE
Preg Test, Ur: NEGATIVE

## 2011-03-14 NOTE — Op Note (Signed)
NAMEIRIA, JAMERSON NO.:  [id number]   MEDICAL RECORD NO.:  [id number]          PATIENT TYPE:  INP   LOCATION:  9307                          [facility]ACILITY:  [facility]   PHYSICIAN:  Charles A. Delcambre, MDDATE OF BIRTH:  1980/11/24   DATE OF PROCEDURE:  07/13/2008  DATE OF DISCHARGE:                               OPERATIVE REPORT   PREOPERATIVE DIAGNOSES:  1. Incompetent cervix failed.  2. Failed cerclage.   POSTOPERATIVE DIAGNOSES:  1. Incompetent cervix failed.  2. Failed cerclage.   PROCEDURE:  1. Removal of cerclage  2. Exam under anesthesia.   SURGEON:  Charles A. Sydnee Cabal, MD   ASSISTANT:  Gerald Leitz, MD   ANESTHESIA:  Monitored anesthesia care.   FINDINGS:  Bulging hourglass membranes; cerclage intact, 3 cm dilated;  minimally effaced on digital examination; and head was palpable   Instrument, sponge, and needle count correct x2.   SPECIMEN:  None.   ESTIMATED BLOOD LOSS:  None.   DESCRIPTION OF PROCEDURE:  The patient was taken to the operating room,  placed supine position and then dorsal lithotomy position.  IV sedation  was accomplished without difficulty.  Speculum was placed in vagina.  Hourglass membranes were at the introitus.  Rupture membranes was  undertaken.  Cerclage was clearly seen on the  cervix.  Fetal parts were still in the uterus.  Cerclage was removed.  Digital exam yielded findings as noted above.  Cytotec 800 mcg were  placed per rectum.  She was taken to recovery with physician in  attendance having tolerated the procedure well, and will go back to AICU  for expected delivery soon.      Charles A. Sydnee Cabal, MD  Electronically Signed     CAD/MEDQ  D:  07/13/2008  T:  07/14/2008  Job:  [id number]

## 2011-03-14 NOTE — Op Note (Signed)
NAME:  Cheryl Spencer, Cheryl Spencer NO.:  [id number]   MEDICAL RECORD NO.:  [id number]          PATIENT TYPE:  AMB   LOCATION:  SDC                           [facility]   PHYSICIAN:  Charles A. Delcambre, MDDATE OF BIRTH:  Apr 09, 1981   DATE OF PROCEDURE:  06/29/2008  DATE OF DISCHARGE:                               OPERATIVE REPORT   PREOPERATIVE DIAGNOSES:  1. Intrauterine pregnancy at 14 weeks and 2 days.  2. Incompetent cervix.   POSTOPERATIVE DIAGNOSES:  1. Intrauterine pregnancy at 14 weeks and 2 days.  2. Incompetent cervix.   PROCEDURE:  McDonald cerclage.   SURGEON:  Charles A. Delcambre, MD   ASSISTANT:  None.   COMPLICATIONS:  None.   BLOOD LOSS:  25 mL.   FINDINGS:  Cervix status post LEEP procedure consistent with shortened  cervix on the vaginal side, but overall length 3.6 cm on ultrasound last  week.  Positive fetal heart tone to the same-day surgery.   Instrument, sponge, and needle count correct x2.   DESCRIPTION OF PROCEDURE:  The patient was taken to the operating room  and placed supine in position.  After spinal was placed without  difficulty, she was noted to have adequate spinal anesthesia and was  placed in dorsal lithotomy position.  A sterile prep and drape was  undertaken.  The cervix was grasped with ring forceps and #1 Ethibond  was placed on a free needle in circumferential fashion by quadrants  anteriorly and then about 9 o'clock, then about 6 o'clock, and then over  to 3 o'clock.  Separation of the entry in the exit Ethibond was about a  centimeter and a suture was then tied with multiple knots secondary to  the suture site ant its type.  The suture knot ended up being  approximately 1-2 o'clock.  Hemostasis was good after suture was cinched  down.  The suture was not tied  tightly but did admit prior to and after tying a small ring forceps  about the size of small Hegar dilator to yield a small amount of patency  to the  cervical canal.  None of the sutures entered the cervical canal.  Procedure was then completed.  The patient was taken to recovery with  physician in attendance having tolerated the procedure well.      Charles A. Sydnee Cabal, MD  Electronically Signed     CAD/MEDQ  D:  06/29/2008  T:  06/29/2008  Job:  [id number]
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
## 2011-03-14 NOTE — Op Note (Signed)
NAME:  Cheryl Spencer, Cheryl Spencer NO.:  [id number]   MEDICAL RECORD NO.:  [id number]          PATIENT TYPE:  AMB   LOCATION:  MATC                          [facility]   PHYSICIAN:  Charles A. Delcambre, MDDATE OF BIRTH:  Sep 24, 1981   DATE OF PROCEDURE:  DATE OF DISCHARGE:                               OPERATIVE REPORT   PREOPERATIVE DIAGNOSES:  Retained products of conception after 16-week  failed cerclage, spontaneous abortion.   POSTOPERATIVE DIAGNOSIS:  Retained products of conception after 16-week  failed cerclage, spontaneous abortion.   PROCEDURE:  Dilation and curettage/suction and sharp D&C plus  paracervical block.   SURGEON:  Charles A. Delcambre, MD   ASSISTANT:  None.   ESTIMATED BLOOD LOSS:  300 mL.   ANESTHESIA:  Monitored anesthesia care with IV sedation.   FINDINGS:  Large amount of necrotic adherent material.  Sound was to 10  cm.   SPECIMENS:  Products of conception to pathology.   COMPLICATIONS:  None.   Instrument, sponge, needle count correct x2.   DESCRIPTION OF PROCEDURE:  The patient was taken to the operating room,  placed in supine position, and IV sedation was accomplished.  She was  placed in dorsal lithotomy position.  Sterile prep and drape was  undertaken.  Weighted speculum was placed in vagina.  A single-tooth  tenaculum was placed on the anterior cervix.  Paracervical block was  with 0.25% plain Marcaine 10 mL at each side at 4 and 8 o'clock were  placed.  There was no evidence of intravascular location of injection.  Cervix remained somewhat open to approximate a Hegar 8.  Hanks dilators  were used to dilate up to this point were easily passed through the  cervix, but no perforation was noted, indicating that the cervix had  been dilated to approximately 8 mm without dilation.  Sound was gently  taken.  Suction curette at 50 mmHg was then placed on an 8-mm curved  curette, and a large amount of tissue was aspirated.   Second pass, a  small amount.  Tissue third, no tissue aspirated, but bleeding picked  up.  Sharp curettage was then undertaken circumferentially to a gritty  feeling with a large amount of tissue adherent in the 7-10 o'clock  region of the uterus.  Continued scraping yielded more tissue, and final  tissue was used was not seen on the final curettage.  She was observed  for several minutes, and the curette was passed again.  No further  bleeding was noted.  She had been given  approximately 5 minutes before that five 200 mcg tablets for total of  1000 mcg of Cytotec per rectum.  Tenaculum was removed.  Hemostasis was  good.  She was given 30 mg IV Toradol and Methergine 0.25 mg during the  case.  The patient tolerated the procedure well and was taken to  recovery with physician in attendance.      Charles A. Sydnee Cabal, MD  Electronically Signed     CAD/MEDQ  D:  08/13/2008  T:  08/14/2008  Job:  [id number]

## 2011-03-14 NOTE — H&P (Signed)
NAME:  Cheryl Spencer, Cheryl Spencer NO.:  [id number]   MEDICAL RECORD NO.:  [id number]         PATIENT TYPE:  WAMB   LOCATION:                                [facility]   PHYSICIAN:  Charles A. Delcambre, MDDATE OF BIRTH:  11-Jan-1981   DATE OF ADMISSION:  06/29/2008  DATE OF DISCHARGE:                              HISTORY & PHYSICAL   This patient to be admitted on June 29, 2008, to undergo cervical  cerclage secondary to likely incompetent cervix in the first pregnancy.  She is a 30 year old gravida 3, para 0-0-2-0.  Last pregnancy, delivery  was about 19-20 weeks, presenting with fully dilated hourglassing  membranes.  She will be 14 weeks and 2 days and cerclage will be done on  outpatient basis.  Dr. Richardson Dopp will cover me in my absence for the next day  for 10-11 days.  She gives informed consent, accepts risk bleeding,  damage to the cervix, rupture of membranes, miscarriage, and I cannot  fully guarantee that she will not go into preterm labor and deliver such  as she had before, although history is more consistent with incompetent  cervix and ultrasound and nature of dilation without pain.   PAST MEDICAL HISTORY:  None.   PAST SURGICAL HISTORY:  Oral surgery.  She has chlamydia treated in  2000, LEEP and laser to the cervix in 2004.   MEDICATIONS:  Prenatal vitamins.   ALLERGIES:  No known drug allergies.   SOCIAL HISTORY:  No tobacco, ethanol, or drug use.   PHYSICAL EXAMINATION:  GENERAL:  Alert and oriented x3.  VITAL SIGNS:  Blood pressure 112/70, respirations 18, and pulse 80.  Fetal heart rate today in 150s.  HEART:  Regular rate and rhythm.  LUNGS:  Clear are bilaterally.  ABDOMEN:  Soft.  PELVIC:  Normal external female genitalia.  Bartholin, urethral, Skene's  normal with no discharge or lesions.  Cervix is closed.  Uterus is 14  weeks size today.   Ultrasound was done on June 22, 2008, and showed cervix to the 3.6 cm.   ASSESSMENT:  History of  incompetent cervix likely.   PLANS:  Cervical cerclage McDonald type, n.p.o. past midnight.     Charles A. Sydnee Cabal, MD  Electronically Signed    CAD/MEDQ  D:  06/24/2008  T:  06/25/2008  Job:  [id number]

## 2011-03-17 NOTE — Discharge Summary (Signed)
NAMEJUDIETH, Cheryl Spencer NO.:  [id number]   MEDICAL RECORD NO.:  [id number]          PATIENT TYPE:  INP   LOCATION:  9307                          [facility]   PHYSICIAN:  Charles A. Delcambre, MDDATE OF BIRTH:  August 30, 1981   DATE OF ADMISSION:  07/11/2008  DATE OF DISCHARGE:  07/15/2008                               DISCHARGE SUMMARY   PRIMARY DISCHARGE DIAGNOSES:  1. Intrauterine pregnancy approximately 16 weeks.  2. Incompetent cervix.  3. Failed cerclage.  4. Anemia from postoperative bleed.   HISTORY:  She is a 30 year old gravida 2, para 0-0-1-0 who presented  with bulging membranes and was admitted for Cytotec and termination as  bulging membranes could not be reduced to further manage with cerclage.   PROCEDURE:  Removal of cerclage exam under anesthesia.  Transfusion of 2  units packed red blood cells.   DISPOSITION:  The patient discharged home to followup in the office in 2  weeks.   MEDICATIONS:  1. Percocet 5/325 one to two p.o. q.4 h. p.r.n., #30.  2. Ambien 10 mg one p.o. nightly as needed, #30.  3. Over-the-counter iron once daily and instructed in condom use.   History and physical is written in the chart.  Fetus and placenta to  pathology.   DISCHARGE LABORATORY:  Hemoglobin 21.1 and hematocrit 7.2.   HOSPITAL COURSE:  The patient was admitted, underwent Cytotec induction  with spontaneous delivery, felt to be intact placenta and fetal demise  noted.  No heartbeat upon palpation or examination of the fetus.  Mother  and father did view fetus and do appropriate bonding and grieving.  She  was transferred to the floor at which point hemoglobin was noted as  above.  She was transfused 2 units of packed red cells and hemoglobin  before discharge was 9.7 and hematocrit 28.6.  She was discharged home  with medications as noted above and to followup in the office in 2  weeks.      Charles A. Sydnee Cabal, MD  Electronically Signed     CAD/MEDQ  D:  09/05/2008  T:  09/05/2008  Job:  [id number]

## 2011-03-17 NOTE — Op Note (Signed)
   NAME:  Cheryl Spencer, Cheryl Spencer                         ACCOUNT NO.:  [id number]   MEDICAL RECORD NO.:  [id number]                   PATIENT TYPE:  AMB   LOCATION:  SDC                                  [facility]   PHYSICIAN:  Genia Del, M.D.             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   SURGERY:  Under general anesthesia with mask laser excision at 11 o'clock  and laser vaporization of ectropion including transformation zone.  Colposcopy prior to laser vaporization.   SURGEON:  Genia Del, M.D.   ANESTHESIOLOGIST:  Raul Del, M.D.   PROCEDURE:  Under MAC analgesia the patient is in lithotomy position.  We  proceed with colposcopy.  Acetic acid is applied on the cervix.  The cervix  is inspected with a colposcope.  A region of acetowhite in punctation is  present at 11 o'clock on the external aspect of the ectropion at the  transformation zone.  Otherwise, mild acetowhite changes are present at the  transformation zone, especially on the inferior aspect of the cervix.  A  paracervical block is done with Nesacaine 1% 20 mL at 4 o'clock and 8  o'clock.  As we start the laser excision at 11 o'clock the patient is  reacting and moving.  The decision is therefore taken to convert to general  anesthesia with mask.  We excise the abnormal area at 11 o'clock with the  laser and then finish the excision with the scissors.  The specimen is  marked superiorly and sent to pathology.  The excision with laser was done  at 20 and then coagulation is achieved at 10.  We then proceed with  vaporization with the CO2 laser at 20 and start by making a circumferential  marking all around the transitional zone and  then vaporizing the inferior  part of the cervix first and then the superior part.  We reach a depth of  about 3 mm.  Hemostasis is adequate.  We complete it with Monsel solution.  The estimated blood loss was 50 mL.  No complications occurred and the  patient was transferred to recovery room in good status.                                               Genia Del, M.D.    ML/MEDQ  D:  12/12/2002  T:  12/12/2002  Job:  [id number]

## 2011-07-21 LAB — CBC
HCT: 27.5 — ABNORMAL LOW
HCT: 37.3
Hemoglobin: 12.9
Hemoglobin: 9.5 — ABNORMAL LOW
MCHC: 34.6
MCHC: 34.8
MCV: 82.8
MCV: 83
Platelets: 243
Platelets: 351
RBC: 3.32 — ABNORMAL LOW
RBC: 4.49
RDW: 13
RDW: 13.2
WBC: 12.7 — ABNORMAL HIGH
WBC: 15.2 — ABNORMAL HIGH

## 2011-07-21 LAB — DIFFERENTIAL
Basophils Absolute: 0
Basophils Absolute: 0.1
Basophils Relative: 0
Basophils Relative: 1
Eosinophils Absolute: 0
Eosinophils Absolute: 0.1
Eosinophils Relative: 0
Eosinophils Relative: 1
Lymphocytes Relative: 13
Lymphocytes Relative: 20
Lymphs Abs: 1.6
Lymphs Abs: 2.7
Monocytes Absolute: 0.8
Monocytes Absolute: 0.8
Monocytes Relative: 6
Monocytes Relative: 6
Neutro Abs: 10 — ABNORMAL HIGH
Neutro Abs: 10.2 — ABNORMAL HIGH
Neutrophils Relative %: 73
Neutrophils Relative %: 80 — ABNORMAL HIGH

## 2011-07-21 LAB — URINALYSIS, ROUTINE W REFLEX MICROSCOPIC
Bilirubin Urine: NEGATIVE
Glucose, UA: NEGATIVE
Ketones, ur: 15 — AB
Leukocytes, UA: NEGATIVE
Nitrite: NEGATIVE
Protein, ur: NEGATIVE
Specific Gravity, Urine: 1.02
Urobilinogen, UA: 0.2
pH: 5.5

## 2011-07-21 LAB — URINE MICROSCOPIC-ADD ON: WBC, UA: NONE SEEN

## 2011-07-21 LAB — TYPE AND SCREEN
ABO/RH(D): O POS
Antibody Screen: NEGATIVE

## 2011-07-28 LAB — URINALYSIS, ROUTINE W REFLEX MICROSCOPIC
Bilirubin Urine: NEGATIVE
Glucose, UA: NEGATIVE
Ketones, ur: 15 — AB
Nitrite: NEGATIVE
Protein, ur: NEGATIVE
Specific Gravity, Urine: 1.015
Urobilinogen, UA: 0.2
pH: 6.5

## 2011-07-28 LAB — BASIC METABOLIC PANEL WITH GFR
BUN: 6
GFR calc Af Amer: >60
GFR calc non Af Amer: >60
Potassium: 3.7
Sodium: 135

## 2011-07-28 LAB — URINE MICROSCOPIC-ADD ON

## 2011-07-28 LAB — BASIC METABOLIC PANEL
CO2: 23
Calcium: 9.3
Chloride: 106
Creatinine, Ser: 0.65
Glucose, Bld: 82

## 2011-07-31 LAB — POCT PREGNANCY, URINE: Preg Test, Ur: POSITIVE

## 2011-07-31 LAB — CROSSMATCH
ABO/RH(D): O POS
Antibody Screen: NEGATIVE

## 2011-07-31 LAB — CBC
HCT: 21.1 — ABNORMAL LOW
HCT: 38.7
Hemoglobin: 7.2 — CL
Hemoglobin: 12.6
MCHC: 34
MCHC: 32.6
MCV: 83.6
MCV: 85.6
Platelets: 232
Platelets: 319
RBC: 2.52 — ABNORMAL LOW
RBC: 4.52
RDW: 14.2
RDW: 14
WBC: 12.8 — ABNORMAL HIGH
WBC: 9.8

## 2011-07-31 LAB — HEMOGLOBIN AND HEMATOCRIT, BLOOD
HCT: 28.6 — ABNORMAL LOW
Hemoglobin: 9.7 — ABNORMAL LOW

## 2011-07-31 LAB — HCG, QUANTITATIVE, PREGNANCY: hCG, Beta Chain, Quant, S: 88 — ABNORMAL HIGH

## 2011-07-31 LAB — ABO/RH: ABO/RH(D): O POS

## 2011-08-02 LAB — URINALYSIS, ROUTINE W REFLEX MICROSCOPIC
Bilirubin Urine: NEGATIVE
Glucose, UA: NEGATIVE
Ketones, ur: NEGATIVE
Leukocytes, UA: NEGATIVE
Nitrite: NEGATIVE
Protein, ur: NEGATIVE
Specific Gravity, Urine: >1.03 — ABNORMAL HIGH
Urobilinogen, UA: 0.2
pH: 6

## 2011-08-02 LAB — URINE MICROSCOPIC-ADD ON

## 2011-08-02 LAB — CBC
HCT: 33 — ABNORMAL LOW
Hemoglobin: 11.1 — ABNORMAL LOW
MCHC: 33.5
MCV: 83.2
Platelets: 285
RBC: 3.97
RDW: 14.4
WBC: 14.2 — ABNORMAL HIGH

## 2011-08-07 LAB — URINALYSIS, ROUTINE W REFLEX MICROSCOPIC
Bilirubin Urine: NEGATIVE
Glucose, UA: NEGATIVE
Ketones, ur: NEGATIVE
Leukocytes, UA: NEGATIVE
Nitrite: NEGATIVE
Protein, ur: NEGATIVE
Specific Gravity, Urine: 1.02
Urobilinogen, UA: 0.2
pH: 7

## 2011-08-07 LAB — CBC
HCT: 38.3
Hemoglobin: 13
MCHC: 34.1
MCV: 82.8
Platelets: 398
RBC: 4.62
RDW: 14
WBC: 9.7

## 2011-08-07 LAB — URINE MICROSCOPIC-ADD ON

## 2011-08-07 LAB — POCT PREGNANCY, URINE
Operator id: 117411
Preg Test, Ur: POSITIVE

## 2011-08-07 LAB — ABO/RH: ABO/RH(D): O POS

## 2011-11-14 ENCOUNTER — Emergency Department (INDEPENDENT_AMBULATORY_CARE_PROVIDER_SITE_OTHER)
Admission: EM | Admit: 2011-11-14 | Discharge: 2011-11-14 | Disposition: A | Discharge status: Home / Self Care | Payer: Self-pay | Source: Home / Self Care | Attending: Family Medicine | Admitting: Family Medicine

## 2011-11-14 ENCOUNTER — Encounter (HOSPITAL_COMMUNITY): Payer: Self-pay | Admitting: *Deleted

## 2011-11-14 DIAGNOSIS — H11439 Conjunctival hyperemia, unspecified eye: Secondary | ICD-10-CM

## 2011-11-14 DIAGNOSIS — R21 Rash and other nonspecific skin eruption: Secondary | ICD-10-CM

## 2011-11-14 MED ORDER — PREDNISONE 10 MG PO TABS: ORAL_TABLET | ORAL | Status: DC

## 2011-11-14 MED ORDER — TOBRAMYCIN 0.3 % OP SOLN: 2.0000 [drp] | OPHTHALMIC | Status: AC

## 2011-11-14 MED ORDER — GRISEOFULVIN ULTRAMICROSIZE 250 MG PO TABS: 250.0000 mg | ORAL_TABLET | Freq: Every day | ORAL | Status: AC

## 2011-11-14 NOTE — Discharge Instructions (Signed)
 Rash, Generic Many things can cause a rash. We are not certain what is causing the rash that you have. Some causes include infection, allergic reactions, medications, and chemicals. Sometimes something in your home that comes in contact with your skin may cause the rash. These include pets, new soaps, cosmetics, and foods. HOME CARE INSTRUCTIONS   Avoid extreme heat or cold, unless otherwise instructed. This can make the itching worse.   A cool bath or shower or a cool washcloth can sometimes ease the itching.   Avoid scratching. This can cause infection.   Take those medications prescribed by your caregiver.  SEEK IMMEDIATE MEDICAL CARE IF:  You develop increasing pain, swelling, or redness.   You develop a fever.   You develop new or severe symptoms such as body aches and pains, diarrhea, vomiting.   Your rash is not better in 3 days.  Document Released: 10/06/2002 Document Revised: 06/28/2011 Document Reviewed: 12/11/2008 [facility] Patient Information 2012 [place], [place].Conjunctivitis Conjunctivitis is commonly called "pink eye." Conjunctivitis can be caused by bacterial or viral infection, allergies, or injuries. There is usually redness of the lining of the eye, itching, discomfort, and sometimes discharge. There may be deposits of matter along the eyelids. A viral infection usually causes a watery discharge, while a bacterial infection causes a yellowish, thick discharge. Pink eye is very contagious and spreads by direct contact. You may be given antibiotic eyedrops as part of your treatment. Before using your eye medicine, remove all drainage from the eye by washing gently with warm water and cotton balls. Continue to use the medication until you have awakened 2 mornings in a row without discharge from the eye. Do not rub your eye. This increases the irritation and helps spread infection. Use separate towels from other household members. Wash your hands with soap and water before and  after touching your eyes. Use cold compresses to reduce pain and sunglasses to relieve irritation from light. Do not wear contact lenses or wear eye makeup until the infection is gone. SEEK MEDICAL CARE IF:   Your symptoms are not better after 3 days of treatment.   You have increased pain or trouble seeing.   The outer eyelids become very red or swollen.  Document Released: 11/23/2004 Document Revised: 06/28/2011 Document Reviewed: 10/16/2005 [facility] Patient Information 2012 [place], [place].

## 2011-11-14 NOTE — ED Notes (Signed)
Woke this AM with left eye redness, swelling and watery drainage.  Also she has irritation on her scalp which has lasted several months  which was Dx'd by her dermatilogist as an infection.  The pt took 30 days of antibiotics for this, but there is still redness on the parietal/occipital scalp.  She noticed enlarged cervical  lymph nodes the last 2 months.    She denies fever.

## 2011-11-14 NOTE — ED Provider Notes (Signed)
History     CSN: [id number]  Arrival date & time 11/14/11  1342   First MD Initiated Contact with Patient 11/14/11 1437      Chief Complaint  Patient presents with  . Eye Pain    (Consider location/radiation/quality/duration/timing/severity/associated sxs/prior treatment) Patient is a 31 y.o. female presenting with eye pain. The history is provided by the patient. No language interpreter was used.  Eye Pain This is a new problem. The current episode started 6 to 12 hours ago. The problem occurs constantly. The problem has not changed since onset.Pertinent negatives include no chest pain. The symptoms are aggravated by nothing. The symptoms are relieved by nothing. She has tried nothing for the symptoms.  Pt complains of irritation to her scalp for 2 months.  Pt reports she was dermatology and they put her on a month of bactrim.  Pt reports it seems worse.  History reviewed. No pertinent past medical history.  Past Surgical History  Procedure Date  . Cesarean section   . Laparoscopy   . Dilation and curettage of uterus     Family History  Problem Relation Age of Onset  . Heart attack Father   . Asthma Brother     History  Substance Use Topics  . Smoking status: Never Smoker   . Smokeless tobacco: Not on file  . Alcohol Use: No    OB History    Grav Para Term Preterm Abortions TAB SAB Ect Mult Living                  Review of Systems  Eyes: Positive for pain.  Cardiovascular: Negative for chest pain.  Skin: Positive for rash.  All other systems reviewed and are negative.    Allergies  Review of patient's allergies indicates no known allergies.  Home Medications  No current outpatient prescriptions on file.  BP 122/75  Pulse 78  Temp(Src) 98.1 F (36.7 C) (Oral)  Resp 18  SpO2 100%  LMP 11/09/2011  Physical Exam  Nursing note and vitals reviewed. Constitutional: She is oriented to person, place, and time. She appears well-developed and  well-nourished.  HENT:  Head: Normocephalic and atraumatic.  Right Ear: External ear normal.  Left Ear: External ear normal.  Nose: Nose normal.  Mouth/Throat: Oropharynx is clear and moist.  Eyes: EOM are normal. Pupils are equal, round, and reactive to light. Right eye exhibits discharge.       Left eye injected  Neck: Normal range of motion. Neck supple.  Cardiovascular: Normal rate and normal heart sounds.   Pulmonary/Chest: Effort normal.  Abdominal: Soft.  Musculoskeletal: Normal range of motion.  Neurological: She is alert and oriented to person, place, and time. She has normal reflexes.  Skin: Skin is warm.       Pustular, oozing rash scalp,   Psychiatric: She has a normal mood and affect.    ED Course  Procedures (including critical care time)  Labs Reviewed - No data to display No results found.   No diagnosis found.    MDM  Dr. Juanetta Gosling and Dr. Lorenz Coaster in to see and examine.   Dr. Lorenz Coaster advised draw ANA,  Try prednisone and griseofulvin.  Pt also given rx for tobrex      Langston Masker, [place] 11/14/11 1508  Langston Masker, [place] 11/14/11 1514

## 2011-11-24 NOTE — ED Provider Notes (Signed)
Medical screening examination/treatment/procedure(s) were performed by non-physician practitioner and as supervising physician I was immediately available for consultation/collaboration.  Corrie Mckusick, MD 11/24/11 1444

## 2011-12-14 ENCOUNTER — Other Ambulatory Visit: Payer: Self-pay | Admitting: Obstetrics and Gynecology

## 2011-12-14 ENCOUNTER — Other Ambulatory Visit (HOSPITAL_COMMUNITY)
Admission: RE | Admit: 2011-12-14 | Discharge: 2011-12-14 | Disposition: A | Discharge status: Home / Self Care | Payer: 59 | Source: Ambulatory Visit | Attending: Obstetrics and Gynecology | Admitting: Obstetrics and Gynecology

## 2011-12-14 DIAGNOSIS — Z01419 Encounter for gynecological examination (general) (routine) without abnormal findings: Secondary | ICD-10-CM | POA: Insufficient documentation

## 2011-12-14 DIAGNOSIS — Z113 Encounter for screening for infections with a predominantly sexual mode of transmission: Secondary | ICD-10-CM | POA: Insufficient documentation

## 2012-08-01 ENCOUNTER — Other Ambulatory Visit (HOSPITAL_COMMUNITY): Payer: Self-pay | Admitting: Internal Medicine

## 2012-08-01 ENCOUNTER — Other Ambulatory Visit: Payer: Self-pay | Admitting: Internal Medicine

## 2012-08-01 DIAGNOSIS — R519 Headache, unspecified: Secondary | ICD-10-CM

## 2012-08-08 ENCOUNTER — Other Ambulatory Visit (HOSPITAL_COMMUNITY): Payer: Self-pay

## 2012-08-08 ENCOUNTER — Ambulatory Visit (HOSPITAL_COMMUNITY)
Admission: RE | Admit: 2012-08-08 | Discharge: 2012-08-08 | Disposition: A | Discharge status: Home / Self Care | Payer: 59 | Source: Ambulatory Visit | Attending: Internal Medicine | Admitting: Internal Medicine

## 2012-08-08 DIAGNOSIS — R519 Headache, unspecified: Secondary | ICD-10-CM

## 2012-08-08 DIAGNOSIS — R51 Headache: Secondary | ICD-10-CM | POA: Insufficient documentation

## 2012-12-17 ENCOUNTER — Other Ambulatory Visit (HOSPITAL_COMMUNITY)
Admission: RE | Admit: 2012-12-17 | Discharge: 2012-12-17 | Disposition: A | Discharge status: Home / Self Care | Payer: 59 | Source: Ambulatory Visit | Attending: Obstetrics and Gynecology | Admitting: Obstetrics and Gynecology

## 2012-12-17 ENCOUNTER — Other Ambulatory Visit: Payer: Self-pay | Admitting: Obstetrics and Gynecology

## 2012-12-17 DIAGNOSIS — Z1151 Encounter for screening for human papillomavirus (HPV): Secondary | ICD-10-CM | POA: Insufficient documentation

## 2012-12-17 DIAGNOSIS — Z01419 Encounter for gynecological examination (general) (routine) without abnormal findings: Secondary | ICD-10-CM | POA: Insufficient documentation

## 2013-06-17 ENCOUNTER — Encounter (HOSPITAL_COMMUNITY): Payer: Self-pay | Admitting: Emergency Medicine

## 2013-06-17 ENCOUNTER — Emergency Department (HOSPITAL_COMMUNITY)
Admission: EM | Admit: 2013-06-17 | Discharge: 2013-06-17 | Disposition: A | Discharge status: Home / Self Care | Payer: Self-pay | Attending: Emergency Medicine | Admitting: Emergency Medicine

## 2013-06-17 ENCOUNTER — Emergency Department (INDEPENDENT_AMBULATORY_CARE_PROVIDER_SITE_OTHER): Payer: 59

## 2013-06-17 ENCOUNTER — Emergency Department (INDEPENDENT_AMBULATORY_CARE_PROVIDER_SITE_OTHER)
Admission: EM | Admit: 2013-06-17 | Discharge: 2013-06-17 | Disposition: A | Discharge status: Nursing Home (Includes ICF) | Payer: Self-pay | Source: Home / Self Care

## 2013-06-17 DIAGNOSIS — R059 Cough, unspecified: Secondary | ICD-10-CM | POA: Insufficient documentation

## 2013-06-17 DIAGNOSIS — J9801 Acute bronchospasm: Secondary | ICD-10-CM

## 2013-06-17 DIAGNOSIS — J3489 Other specified disorders of nose and nasal sinuses: Secondary | ICD-10-CM | POA: Insufficient documentation

## 2013-06-17 DIAGNOSIS — R0789 Other chest pain: Secondary | ICD-10-CM | POA: Insufficient documentation

## 2013-06-17 DIAGNOSIS — J069 Acute upper respiratory infection, unspecified: Secondary | ICD-10-CM

## 2013-06-17 DIAGNOSIS — J40 Bronchitis, not specified as acute or chronic: Secondary | ICD-10-CM | POA: Insufficient documentation

## 2013-06-17 DIAGNOSIS — R05 Cough: Secondary | ICD-10-CM | POA: Insufficient documentation

## 2013-06-17 MED ORDER — PREDNISONE 20 MG PO TABS: 60.0000 mg | ORAL_TABLET | Freq: Once | ORAL | Status: DC

## 2013-06-17 MED ORDER — ALBUTEROL SULFATE (5 MG/ML) 0.5% IN NEBU
5.0000 mg | INHALATION_SOLUTION | Freq: Once | RESPIRATORY_TRACT | Status: AC
  Administered 2013-06-17: 5 mg via RESPIRATORY_TRACT

## 2013-06-17 MED ORDER — ALBUTEROL SULFATE HFA 108 (90 BASE) MCG/ACT IN AERS
2.0000 | INHALATION_SPRAY | Freq: Once | RESPIRATORY_TRACT | Status: AC
  Administered 2013-06-17: 2 via RESPIRATORY_TRACT
  Filled 2013-06-17: qty 6.7

## 2013-06-17 MED ORDER — TRIAMCINOLONE ACETONIDE 40 MG/ML IJ SUSP
INTRAMUSCULAR | Status: AC
  Filled 2013-06-17: qty 1

## 2013-06-17 MED ORDER — IPRATROPIUM BROMIDE 0.02 % IN SOLN
0.5000 mg | Freq: Once | RESPIRATORY_TRACT | Status: AC
  Administered 2013-06-17: 0.5 mg via RESPIRATORY_TRACT

## 2013-06-17 MED ORDER — ONDANSETRON 4 MG PO TBDP
8.0000 mg | ORAL_TABLET | Freq: Once | ORAL | Status: AC
  Administered 2013-06-17: 8 mg via ORAL
  Filled 2013-06-17: qty 2

## 2013-06-17 MED ORDER — ALBUTEROL SULFATE (5 MG/ML) 0.5% IN NEBU
INHALATION_SOLUTION | RESPIRATORY_TRACT | Status: AC
  Filled 2013-06-17: qty 1

## 2013-06-17 MED ORDER — TRIAMCINOLONE ACETONIDE 40 MG/ML IJ SUSP
40.0000 mg | Freq: Once | INTRAMUSCULAR | Status: AC
  Administered 2013-06-17: 40 mg via INTRAMUSCULAR

## 2013-06-17 MED ORDER — PREDNISONE 20 MG PO TABS: 40.0000 mg | ORAL_TABLET | Freq: Every day | ORAL | Status: DC

## 2013-06-17 MED ORDER — HYDROCODONE-HOMATROPINE 5-1.5 MG/5ML PO SYRP
5.0000 mL | ORAL_SOLUTION | Freq: Once | ORAL | Status: AC
  Administered 2013-06-17: 5 mL via ORAL
  Filled 2013-06-17: qty 5

## 2013-06-17 MED ORDER — ALBUTEROL (5 MG/ML) CONTINUOUS INHALATION SOLN
10.0000 mg/h | INHALATION_SOLUTION | RESPIRATORY_TRACT | Status: AC
  Administered 2013-06-17: 10 mg/h via RESPIRATORY_TRACT
  Filled 2013-06-17: qty 20

## 2013-06-17 MED ORDER — HYDROCODONE-HOMATROPINE 5-1.5 MG/5ML PO SYRP: 5.0000 mL | ORAL_SOLUTION | Freq: Four times a day (QID) | ORAL | Status: DC | PRN

## 2013-06-17 NOTE — ED Provider Notes (Signed)
CSN: [id number]     Arrival date & time 06/17/13  1933 History     First MD Initiated Contact with Patient 06/17/13 2032     Chief Complaint  Patient presents with  . Bronchitis   (Consider location/radiation/quality/duration/timing/severity/associated sxs/prior Treatment) HPI Comments: Patient is an otherwise healthy 32 year old female who presents for upper respiratory symptoms, chest congestion, and cough x3 weeks. Patient states that symptoms began as a productive cough with green/yellow phlegm. Symptoms have progressed to include chest congestion, nasal congestion, and rhinorrhea. Patient's been taking Histinex for symptoms without relief. She was evaluated today at urgent care but a chest x-ray. Chest x-ray findings were negative for pneumonia, but did have findings consistent with bronchitis. Patient was given into duo nebs as well as IM steroids; sent to ED for continuous wheezing. Patient denies associated fever, vision changes, ear pain or discharge, sore throat, difficulty swallowing, chest pain, nausea or vomiting, numbess/tingling in her extremities, and extremity weakness. Denies hx of asthma.  The history is provided by the patient. No language interpreter was used.    History reviewed. No pertinent past medical history. Past Surgical History  Procedure Laterality Date  . Cesarean section    . Laparoscopy    . Dilation and curettage of uterus     Family History  Problem Relation Age of Onset  . Heart attack Father   . Asthma Brother    History  Substance Use Topics  . Smoking status: Never Smoker   . Smokeless tobacco: Not on file  . Alcohol Use: No   OB History   Grav Para Term Preterm Abortions TAB SAB Ect Mult Living                 Review of Systems  Constitutional: Negative for fever.  HENT: Positive for congestion and rhinorrhea. Negative for ear pain, neck pain, neck stiffness and ear discharge.   Respiratory: Positive for cough and wheezing.    Cardiovascular: Negative for chest pain.  Gastrointestinal: Negative for nausea and vomiting.  Skin: Negative for rash.  Neurological: Negative for weakness and numbness.  All other systems reviewed and are negative.    Allergies  Review of patient's allergies indicates no known allergies.  Home Medications   Current Outpatient Rx  Name  Route  Sig  Dispense  Refill  . GuaiFENesin (MUCINEX PO)   Oral   Take 1 tablet by mouth every 12 (twelve) hours as needed (congestion).         Marland Kitchen HYDROcodone-homatropine (HYCODAN) 5-1.5 MG/5ML syrup   Oral   Take 5 mL by mouth every 6 (six) hours as needed for cough.   120 mL   0   . predniSONE (DELTASONE) 20 MG tablet   Oral   Take 2 tablets (40 mg total) by mouth daily.   10 tablet   0    BP 107/79  Pulse 142  Temp(Src) 97 F (36.1 C) (Oral)  Resp 18  SpO2 99%  LMP 05/09/2013  Physical Exam  Nursing note and vitals reviewed. Constitutional: She appears well-developed and well-nourished. No distress.  HENT:  Head: Normocephalic and atraumatic.  Mouth/Throat: Oropharynx is clear and moist. No oropharyngeal exudate.  Eyes: Conjunctivae and EOM are normal. No scleral icterus.  Neck: Normal range of motion.  Cardiovascular: Regular rhythm, normal heart sounds and intact distal pulses.   Tachy to 102  Pulmonary/Chest: Effort normal. No respiratory distress. She has no rales.  Coarse breath sounds in b/l bases with mild expiratory wheezes.  Musculoskeletal: Normal range of motion.  Skin: Skin is warm and dry. No rash noted. She is not diaphoretic. No erythema. No pallor.  Psychiatric: She has a normal mood and affect. Her behavior is normal.   ED Course   Procedures (including critical care time)  Labs Reviewed - No data to display Dg Chest 2 View  06/17/2013   *RADIOLOGY REPORT*  Clinical Data: Shortness of breath, cough and chest pain.  CHEST - 2 VIEW  Comparison: None.  Findings: Heart size and vascularity are normal  and the lungs are clear except for slight peribronchial thickening consistent with bronchitis.  No osseous abnormality.  IMPRESSION: Slight bronchitic changes.   Original Report Authenticated By: Francene Boyers, M.D.   1. Bronchitis    MDM  2:61 - 44 year old otherwise healthy female who presents from urgent care for wheezing secondary to bronchitis. Chest x-ray without evidence of pneumonia, pneumothorax, pleural effusion, or mass lesion. Findings suggestive of bronchitis. Patient received 2 duo nebs as well as IM steroids at urgent care. Physical exam findings as above. Tachycardia likely secondary to albuterol treatments x2. Will tx with continuous albuterol neb and reassess. Patient satting 100% on room air.  9:45 - Patient with markedly improved air movement after continuous nebulizer tx. Patient is well and nontoxic appearing without dyspnea. She is not tachypneic or hypoxic. Anticipate d/c home with PO prednisone, Mucinex-D, hycodan for cough, and albuterol inhaler PRN for symptoms as well as PCP follow up. Indications for ED return discussed with patient who is agreeable to this d/c plan with no unaddressed concerns. Will check pulse ox while ambulating.  10:10 - oxygen saturations remained at 99% while ambulating. Rapid heart rate appropriate in light of recent continuous nebulizer treatment. Patient stable and appropriate for d/c.  Antony Madura, PA-C 06/17/13 2212

## 2013-06-17 NOTE — ED Notes (Signed)
Pt c/o cold sxs onset 3 weeks Sxs include: cough w/green phlegm, nasal congestion, vomiting, wheezing and intermittent CP Denies: f/n/d, SOB... Taking mucinex w/no relief Alert w/no signs of acute distress.... Wheezing heard throughout

## 2013-06-17 NOTE — Discharge Instructions (Signed)
 Recommend that you use Mucinex-D for cough and congestion. This can be found OTC at your local pharmacy. Take prednisone  as it has been prescribed to you. You may use hycodan for severe cough; this medication may make you drowsy. Use the albuterol  inhaler 2 puffs every 4 hours as needed for shortness of breath. Follow up with your primary care doctor in 2-3 days to ensure that symptoms are not worsening. Return to the ED if symptoms worsen.  Bronchitis Bronchitis is the body's way of reacting to injury and/or infection (inflammation) of the bronchi. Bronchi are the air tubes that extend from the windpipe into the lungs. If the inflammation becomes severe, it may cause shortness of breath. CAUSES  Inflammation may be caused by:  A virus.  Germs (bacteria).  Dust.  Allergens.  Pollutants and many other irritants. The cells lining the bronchial tree are covered with tiny hairs (cilia). These constantly beat upward, away from the lungs, toward the mouth. This keeps the lungs free of pollutants. When these cells become too irritated and are unable to do their job, mucus begins to develop. This causes the characteristic cough of bronchitis. The cough clears the lungs when the cilia are unable to do their job. Without either of these protective mechanisms, the mucus would settle in the lungs. Then you would develop pneumonia. Smoking is a common cause of bronchitis and can contribute to pneumonia. Stopping this habit is the single most important thing you can do to help yourself. TREATMENT   Your caregiver may prescribe an antibiotic if the cough is caused by bacteria. Also, medicines that open up your airways make it easier to breathe. Your caregiver may also recommend or prescribe an expectorant. It will loosen the mucus to be coughed up. Only take over-the-counter or prescription medicines for pain, discomfort, or fever as directed by your caregiver.  Removing whatever causes the problem (smoking,  for example) is critical to preventing the problem from getting worse.  Cough suppressants may be prescribed for relief of cough symptoms.  Inhaled medicines may be prescribed to help with symptoms now and to help prevent problems from returning.  For those with recurrent (chronic) bronchitis, there may be a need for steroid medicines. SEEK IMMEDIATE MEDICAL CARE IF:   During treatment, you develop more pus-like mucus (purulent sputum).  You have a fever.  Your baby is older than 3 months with a rectal temperature of 102 F (38.9 C) or higher.  Your baby is 48 months old or younger with a rectal temperature of 100.4 F (38 C) or higher.  You become progressively more ill.  You have increased difficulty breathing, wheezing, or shortness of breath. It is necessary to seek immediate medical care if you are elderly or sick from any other disease. MAKE SURE YOU:   Understand these instructions.  Will watch your condition.  Will get help right away if you are not doing well or get worse. Document Released: 10/16/2005 Document Revised: 01/08/2012 Document Reviewed: 08/25/2008 [facility] Patient Information 2014 [place], [place].

## 2013-06-17 NOTE — ED Notes (Addendum)
PT. TRANSFERRED FROM [facility] DIAGNOSED WITH BRONCHITIS , REPORTS PRODUCTIVE COUGH X 3 WEEKS WITH CHEST CONGESTION , RUNNY NOSE / NASAL CONGESTION , DENIES FEVER OR CHILLS. CHEST X-RAY DONE AT URGENT CARE , RECEIVED 2 DOSES OF ALBUTEROL NEBULIZER TREAT MENT AND KENALOG 40 MG IM AT URGENT CARE.

## 2013-06-17 NOTE — ED Provider Notes (Signed)
CSN: [id number]     Arrival date & time 06/17/13  1547 History     First MD Initiated Contact with Patient 06/17/13 [phone]     Chief Complaint  Patient presents with  . URI   (Consider location/radiation/quality/duration/timing/severity/associated sxs/prior Treatment) HPI Comments: -year-old female presents with a cough for 3 weeks. Proximally 4-5 days ago she developed upper respiratory congestion with rhinorrhea, PND and nasal stuffiness. She denies fever but has had occasional shortness of breath. Symptoms are worse at nighttime, especially the cough.   History reviewed. No pertinent past medical history. Past Surgical History  Procedure Laterality Date  . Cesarean section    . Laparoscopy    . Dilation and curettage of uterus     Family History  Problem Relation Age of Onset  . Heart attack Father   . Asthma Brother    History  Substance Use Topics  . Smoking status: Never Smoker   . Smokeless tobacco: Not on file  . Alcohol Use: No   OB History   Grav Para Term Preterm Abortions TAB SAB Ect Mult Living                 Review of Systems  Constitutional: Positive for activity change. Negative for fever and appetite change.  HENT: Positive for congestion, rhinorrhea and postnasal drip. Negative for hearing loss, sore throat, mouth sores and ear discharge.   Respiratory: Positive for cough, shortness of breath and wheezing.   Cardiovascular: Positive for chest pain. Negative for palpitations.  Gastrointestinal: Negative.   Neurological: Negative.     Allergies  Review of patient's allergies indicates no known allergies.  Home Medications   Current Outpatient Rx  Name  Route  Sig  Dispense  Refill  . predniSONE (DELTASONE) 10 MG tablet      6,5,4,3,2,1 taper   21 tablet   0    BP 125/88  Pulse 99  Temp(Src) 97.8 F (36.6 C) (Oral)  Resp 16  SpO2 100%  LMP 05/09/2013 Physical Exam  Nursing note and vitals reviewed. Constitutional: She is oriented to  person, place, and time. She appears well-developed and well-nourished. No distress.  HENT:  Mouth/Throat: Oropharynx is clear and moist. No oropharyngeal exudate.  Bilateral TMs are normal Oropharynx without erythema or swelling.  Neck: Normal range of motion. Neck supple.  Cardiovascular: Normal rate, regular rhythm and normal heart sounds.   Pulmonary/Chest: Effort normal. No respiratory distress. She has wheezes. She has no rales. She exhibits no tenderness.  Frequent coughing with bilateral upper wheezing primarily expiratory and lower field coarseness. Prolonged expiratory phase.  Musculoskeletal: She exhibits no edema and no tenderness.  Lymphadenopathy:    She has no cervical adenopathy.  Neurological: She is alert and oriented to person, place, and time. She exhibits normal muscle tone.  Skin: Skin is warm and dry.  Psychiatric: She has a normal mood and affect.    ED Course   Procedures (including critical care time)  Labs Reviewed - No data to display Dg Chest 2 View  06/17/2013   *RADIOLOGY REPORT*  Clinical Data: Shortness of breath, cough and chest pain.  CHEST - 2 VIEW  Comparison: None.  Findings: Heart size and vascularity are normal and the lungs are clear except for slight peribronchial thickening consistent with bronchitis.  No osseous abnormality.  IMPRESSION: Slight bronchitic changes.   Original Report Authenticated By: Francene Boyers, M.D.   1. Bronchospasm, acute   2. Upper respiratory infection with cough and congestion  MDM   1735 hours patient states that she does not feel much better and she still has her cough. This is after she has finished a duo neb. Auscultation reveals some improvement in air movement but continues to have wheezing.  1845 hours the patient had completed her second albuterol nebulizer and she is still not improved. She continues to cough have wheezes and coarseness. X-ray is showing airway thickening suggestive of bronchitis. Since  we are unable to improve her respiratory status she will be transferred to the emergency department for continued nebulizers or other management as needed.  Hayden Rasmussen, NP 06/17/13 [phone]

## 2013-06-17 NOTE — ED Provider Notes (Signed)
Medical screening examination/treatment/procedure(s) were performed by non-physician practitioner and as supervising physician I was immediately available for consultation/collaboration.  Leny Morozov, M.D.  Jere Vanburen C Daltin Crist, MD 06/17/13 2216 

## 2013-06-18 NOTE — ED Provider Notes (Signed)
Medical screening examination/treatment/procedure(s) were performed by non-physician practitioner and as supervising physician I was immediately available for consultation/collaboration.  Enid Skeens, MD 06/18/13 [phone]

## 2013-12-17 ENCOUNTER — Other Ambulatory Visit (HOSPITAL_COMMUNITY)
Admission: RE | Admit: 2013-12-17 | Discharge: 2013-12-17 | Disposition: A | Discharge status: Home / Self Care | Payer: 59 | Source: Ambulatory Visit | Attending: Obstetrics and Gynecology | Admitting: Obstetrics and Gynecology

## 2013-12-17 ENCOUNTER — Other Ambulatory Visit: Payer: Self-pay | Admitting: Obstetrics and Gynecology

## 2013-12-17 DIAGNOSIS — Z01419 Encounter for gynecological examination (general) (routine) without abnormal findings: Secondary | ICD-10-CM | POA: Insufficient documentation

## 2013-12-19 ENCOUNTER — Other Ambulatory Visit: Payer: Self-pay | Admitting: Gastroenterology

## 2013-12-19 DIAGNOSIS — R112 Nausea with vomiting, unspecified: Secondary | ICD-10-CM

## 2013-12-19 DIAGNOSIS — R1013 Epigastric pain: Secondary | ICD-10-CM

## 2013-12-25 ENCOUNTER — Other Ambulatory Visit: Payer: Self-pay

## 2013-12-29 ENCOUNTER — Ambulatory Visit
Admission: RE | Admit: 2013-12-29 | Discharge: 2013-12-29 | Disposition: A | Discharge status: Home / Self Care | Payer: 59 | Source: Ambulatory Visit | Attending: Gastroenterology | Admitting: Gastroenterology

## 2013-12-29 DIAGNOSIS — R112 Nausea with vomiting, unspecified: Secondary | ICD-10-CM

## 2013-12-29 DIAGNOSIS — R1013 Epigastric pain: Secondary | ICD-10-CM

## 2014-01-02 ENCOUNTER — Other Ambulatory Visit: Payer: Self-pay | Admitting: Gastroenterology

## 2014-01-02 DIAGNOSIS — K769 Liver disease, unspecified: Secondary | ICD-10-CM

## 2014-01-07 ENCOUNTER — Other Ambulatory Visit: Payer: Self-pay | Admitting: Gastroenterology

## 2014-01-09 ENCOUNTER — Ambulatory Visit
Admission: RE | Admit: 2014-01-09 | Discharge: 2014-01-09 | Disposition: A | Discharge status: Home / Self Care | Payer: 59 | Source: Ambulatory Visit | Attending: Gastroenterology | Admitting: Gastroenterology

## 2014-01-09 DIAGNOSIS — K769 Liver disease, unspecified: Secondary | ICD-10-CM

## 2014-01-09 MED ORDER — IOHEXOL 300 MG/ML  SOLN
125.0000 mL | Freq: Once | INTRAMUSCULAR | Status: AC | PRN
  Administered 2014-01-09: 125 mL via INTRAVENOUS

## 2014-03-27 ENCOUNTER — Other Ambulatory Visit: Payer: Self-pay | Admitting: Gastroenterology

## 2014-03-27 ENCOUNTER — Other Ambulatory Visit (HOSPITAL_COMMUNITY): Payer: Self-pay | Admitting: Gastroenterology

## 2014-03-27 DIAGNOSIS — R110 Nausea: Secondary | ICD-10-CM

## 2014-03-27 DIAGNOSIS — K769 Liver disease, unspecified: Secondary | ICD-10-CM

## 2014-04-04 ENCOUNTER — Ambulatory Visit
Admission: RE | Admit: 2014-04-04 | Discharge: 2014-04-04 | Disposition: A | Discharge status: Home / Self Care | Payer: 59 | Source: Ambulatory Visit | Attending: Gastroenterology | Admitting: Gastroenterology

## 2014-04-04 DIAGNOSIS — K769 Liver disease, unspecified: Secondary | ICD-10-CM

## 2014-04-04 MED ORDER — GADOXETATE DISODIUM 0.25 MMOL/ML IV SOLN
9.0000 mL | Freq: Once | INTRAVENOUS | Status: AC | PRN
  Administered 2014-04-04: 9 mL via INTRAVENOUS

## 2014-04-06 ENCOUNTER — Ambulatory Visit (HOSPITAL_COMMUNITY): Admission: RE | Admit: 2014-04-06 | Payer: 59 | Source: Ambulatory Visit

## 2014-06-11 ENCOUNTER — Ambulatory Visit (HOSPITAL_COMMUNITY): Payer: 59

## 2014-07-23 ENCOUNTER — Ambulatory Visit (HOSPITAL_COMMUNITY): Admission: RE | Admit: 2014-07-23 | Payer: 59 | Source: Ambulatory Visit

## 2014-09-03 ENCOUNTER — Encounter (HOSPITAL_COMMUNITY)
Admission: RE | Admit: 2014-09-03 | Discharge: 2014-09-03 | Disposition: A | Discharge status: Home / Self Care | Payer: 59 | Source: Ambulatory Visit | Attending: Gastroenterology | Admitting: Gastroenterology

## 2014-09-03 DIAGNOSIS — R110 Nausea: Secondary | ICD-10-CM | POA: Insufficient documentation

## 2014-09-03 MED ORDER — TECHNETIUM TC 99M SULFUR COLLOID
2.0000 | Freq: Once | INTRAVENOUS | Status: AC | PRN
  Administered 2014-09-03: 2 via ORAL

## 2014-09-07 ENCOUNTER — Other Ambulatory Visit (HOSPITAL_COMMUNITY): Payer: Self-pay | Admitting: Gastroenterology

## 2014-09-07 DIAGNOSIS — R112 Nausea with vomiting, unspecified: Secondary | ICD-10-CM

## 2014-09-07 DIAGNOSIS — R1013 Epigastric pain: Secondary | ICD-10-CM

## 2014-09-22 ENCOUNTER — Encounter (HOSPITAL_COMMUNITY): Admission: RE | Admit: 2014-09-22 | Payer: 59 | Source: Ambulatory Visit

## 2014-12-28 ENCOUNTER — Other Ambulatory Visit (HOSPITAL_COMMUNITY)
Admission: RE | Admit: 2014-12-28 | Discharge: 2014-12-28 | Disposition: A | Discharge status: Home / Self Care | Payer: 59 | Source: Ambulatory Visit | Attending: Obstetrics and Gynecology | Admitting: Obstetrics and Gynecology

## 2014-12-28 ENCOUNTER — Other Ambulatory Visit: Payer: Self-pay | Admitting: Obstetrics and Gynecology

## 2014-12-28 DIAGNOSIS — Z01419 Encounter for gynecological examination (general) (routine) without abnormal findings: Secondary | ICD-10-CM | POA: Insufficient documentation

## 2014-12-28 DIAGNOSIS — Z113 Encounter for screening for infections with a predominantly sexual mode of transmission: Secondary | ICD-10-CM | POA: Diagnosis present

## 2014-12-29 LAB — CYTOLOGY - PAP

## 2015-04-06 ENCOUNTER — Other Ambulatory Visit: Payer: Self-pay | Admitting: Gastroenterology

## 2015-04-06 DIAGNOSIS — K769 Liver disease, unspecified: Secondary | ICD-10-CM

## 2015-04-15 ENCOUNTER — Other Ambulatory Visit: Payer: 59

## 2015-04-22 ENCOUNTER — Other Ambulatory Visit: Payer: 59

## 2015-11-03 MED FILL — PANTOPRAZOLE SOD DR 40 MG T: 40 | 30 days supply | Qty: 30 | Fill #1

## 2015-12-27 MED FILL — PANTOPRAZOLE SOD DR 40 MG T: 40 | 30 days supply | Qty: 30 | Fill #0

## 2016-01-05 MED FILL — ESZOPICLONE 1 MG TABLET: 1 | 30 days supply | Qty: 30 | Fill #0

## 2016-01-06 ENCOUNTER — Other Ambulatory Visit (HOSPITAL_COMMUNITY)
Admission: RE | Admit: 2016-01-06 | Discharge: 2016-01-06 | Disposition: A | Discharge status: Home / Self Care | Payer: 59 | Source: Ambulatory Visit | Attending: Obstetrics and Gynecology | Admitting: Obstetrics and Gynecology

## 2016-01-06 ENCOUNTER — Other Ambulatory Visit: Payer: Self-pay | Admitting: Obstetrics and Gynecology

## 2016-01-06 DIAGNOSIS — Z1151 Encounter for screening for human papillomavirus (HPV): Secondary | ICD-10-CM | POA: Diagnosis present

## 2016-01-06 DIAGNOSIS — Z01419 Encounter for gynecological examination (general) (routine) without abnormal findings: Secondary | ICD-10-CM | POA: Diagnosis present

## 2016-01-07 LAB — CYTOLOGY - PAP

## 2016-01-21 MED FILL — DOXYCYCLINE HYCLATE 100 MG: 100 | 7 days supply | Qty: 14 | Fill #0

## 2016-01-21 MED FILL — PANTOPRAZOLE SOD DR 40 MG T: 40 | 30 days supply | Qty: 30 | Fill #1

## 2016-02-03 MED FILL — ESZOPICLONE 1 MG TABLET: 1 | 30 days supply | Qty: 30 | Fill #1

## 2016-03-02 MED FILL — ESZOPICLONE 1 MG TABLET: 1 | 30 days supply | Qty: 30 | Fill #2

## 2016-03-31 MED FILL — ESZOPICLONE 1 MG TABLET: 1 | 30 days supply | Qty: 30 | Fill #3

## 2016-05-04 MED FILL — ESZOPICLONE 2 MG TABLET: 2 | 30 days supply | Qty: 30 | Fill #0

## 2016-06-02 MED FILL — ESZOPICLONE 2 MG TABLET: 2 | 30 days supply | Qty: 30 | Fill #1

## 2016-06-30 MED FILL — ESZOPICLONE 2 MG TABLET: 2 | 30 days supply | Qty: 30 | Fill #2

## 2016-07-28 MED FILL — ESZOPICLONE 2 MG TABLET: 2 | 30 days supply | Qty: 30 | Fill #3

## 2016-08-25 MED FILL — ESZOPICLONE 3 MG TABLET: 3 | 30 days supply | Qty: 30 | Fill #0

## 2016-10-02 MED FILL — ESZOPICLONE 3 MG TABLET: 3 | 30 days supply | Qty: 30 | Fill #1

## 2016-10-30 MED FILL — ESZOPICLONE 3 MG TABLET: 3 | 30 days supply | Qty: 30 | Fill #2

## 2016-11-29 MED FILL — ESZOPICLONE 3 MG TABLET: 3 | 30 days supply | Qty: 30 | Fill #3

## 2016-12-27 MED FILL — ESZOPICLONE 3 MG TABLET: 3 | 30 days supply | Qty: 30 | Fill #4

## 2017-01-05 DIAGNOSIS — L668 Other cicatricial alopecia: Secondary | ICD-10-CM | POA: Diagnosis not present

## 2017-01-05 MED FILL — FLUOCINOLONE 0.01% SCALP OI: 0.01 | 30 days supply | Qty: 118 | Fill #0

## 2017-01-30 MED FILL — ESZOPICLONE 3 MG TABLET: 3 | 30 days supply | Qty: 30 | Fill #5

## 2017-02-12 DIAGNOSIS — Z0000 Encounter for general adult medical examination without abnormal findings: Secondary | ICD-10-CM | POA: Diagnosis not present

## 2017-02-12 DIAGNOSIS — E663 Overweight: Secondary | ICD-10-CM | POA: Diagnosis not present

## 2017-02-12 DIAGNOSIS — Z6838 Body mass index (BMI) 38.0-38.9, adult: Secondary | ICD-10-CM | POA: Diagnosis not present

## 2017-02-12 DIAGNOSIS — Z13220 Encounter for screening for lipoid disorders: Secondary | ICD-10-CM | POA: Diagnosis not present

## 2017-02-19 MED FILL — clonazePAM 0.5 MG TABS: 0.5 | 10 days supply | Qty: 20 | Fill #0

## 2017-02-20 DIAGNOSIS — M549 Dorsalgia, unspecified: Secondary | ICD-10-CM | POA: Diagnosis not present

## 2017-02-20 DIAGNOSIS — Z01411 Encounter for gynecological examination (general) (routine) with abnormal findings: Secondary | ICD-10-CM | POA: Diagnosis not present

## 2017-02-20 DIAGNOSIS — R102 Pelvic and perineal pain: Secondary | ICD-10-CM | POA: Diagnosis not present

## 2017-02-22 DIAGNOSIS — R102 Pelvic and perineal pain: Secondary | ICD-10-CM | POA: Diagnosis not present

## 2017-02-26 DIAGNOSIS — L810 Postinflammatory hyperpigmentation: Secondary | ICD-10-CM | POA: Diagnosis not present

## 2017-02-26 DIAGNOSIS — L738 Other specified follicular disorders: Secondary | ICD-10-CM | POA: Diagnosis not present

## 2017-02-26 DIAGNOSIS — L669 Cicatricial alopecia, unspecified: Secondary | ICD-10-CM | POA: Diagnosis not present

## 2017-03-01 MED FILL — ESZOPICLONE 3 MG TABLET: 3 | 30 days supply | Qty: 30 | Fill #0

## 2017-03-29 MED FILL — ESZOPICLONE 3 MG TABLET: 3 | 30 days supply | Qty: 30 | Fill #1

## 2017-04-30 MED FILL — ESZOPICLONE 3 MG TABLET: 3 | 30 days supply | Qty: 30 | Fill #2

## 2017-05-24 DIAGNOSIS — F418 Other specified anxiety disorders: Secondary | ICD-10-CM | POA: Diagnosis not present

## 2017-05-24 MED FILL — ESCITALOPRAM 10 MG TABLET: 10 | 30 days supply | Qty: 30 | Fill #0

## 2017-05-24 MED FILL — ALPRAZolam 0.25 MG TABS: 0.25 | 30 days supply | Qty: 15 | Fill #0

## 2017-07-03 MED FILL — ESZOPICLONE 3 MG TABLET: 3 | 30 days supply | Qty: 30 | Fill #3

## 2017-07-03 MED FILL — ESCITALOPRAM 10 MG TABLET: 10 | 30 days supply | Qty: 30 | Fill #1

## 2017-07-26 DIAGNOSIS — F329 Major depressive disorder, single episode, unspecified: Secondary | ICD-10-CM | POA: Diagnosis not present

## 2017-07-26 DIAGNOSIS — F419 Anxiety disorder, unspecified: Secondary | ICD-10-CM | POA: Diagnosis not present

## 2017-07-26 MED FILL — ALPRAZolam 0.25 MG TABS: 0.25 | 30 days supply | Qty: 15 | Fill #0

## 2017-07-27 MED FILL — ESCITALOPRAM 10 MG TABLET: 10 | 30 days supply | Qty: 30 | Fill #0

## 2017-07-31 MED FILL — ESZOPICLONE 3 MG TABLET: 3 | 30 days supply | Qty: 30 | Fill #4

## 2017-08-23 MED FILL — ZOLPIDEM TARTRATE 10 MG TAB: 10 | 30 days supply | Qty: 30 | Fill #0

## 2018-07-24 ENCOUNTER — Encounter (HOSPITAL_COMMUNITY): Payer: Self-pay | Admitting: *Deleted

## 2018-07-24 ENCOUNTER — Other Ambulatory Visit: Payer: Self-pay

## 2018-07-24 ENCOUNTER — Emergency Department (HOSPITAL_COMMUNITY)
Admission: EM | Admit: 2018-07-24 | Discharge: 2018-07-25 | Disposition: A | Discharge status: Home / Self Care | Payer: Self-pay | Attending: Emergency Medicine | Admitting: Emergency Medicine

## 2018-07-24 DIAGNOSIS — Y999 Unspecified external cause status: Secondary | ICD-10-CM | POA: Insufficient documentation

## 2018-07-24 DIAGNOSIS — S161XXA Strain of muscle, fascia and tendon at neck level, initial encounter: Secondary | ICD-10-CM | POA: Insufficient documentation

## 2018-07-24 DIAGNOSIS — Y9389 Activity, other specified: Secondary | ICD-10-CM | POA: Insufficient documentation

## 2018-07-24 DIAGNOSIS — Y929 Unspecified place or not applicable: Secondary | ICD-10-CM | POA: Insufficient documentation
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
## 2018-07-25 MED ORDER — CYCLOBENZAPRINE HCL 10 MG PO TABS
5.0000 mg | ORAL_TABLET | Freq: Once | ORAL | Status: AC
  Administered 2018-07-25: 5 mg via ORAL
  Filled 2018-07-25: qty 1

## 2018-07-25 MED ORDER — CYCLOBENZAPRINE HCL 10 MG PO TABS: 10.0000 mg | ORAL_TABLET | Freq: Two times a day (BID) | ORAL | 0 refills | Status: AC | PRN

## 2018-07-25 MED FILL — CYCLOBENZAPRINE 10 MG TAB: 10 | 10 days supply | Qty: 20 | Fill #0
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
## 2018-07-25 NOTE — ED Notes (Signed)
Patient verbalizes understanding of discharge instructions. Opportunity for questioning and answers were provided. Armband removed by staff, pt discharged from ED ambulatory.   

## 2019-04-28 ENCOUNTER — Other Ambulatory Visit: Payer: Self-pay

## 2019-04-28 ENCOUNTER — Ambulatory Visit
Admission: EM | Admit: 2019-04-28 | Discharge: 2019-04-28 | Disposition: A | Discharge status: Home / Self Care | Payer: PRIVATE HEALTH INSURANCE | Attending: Physician Assistant | Admitting: Physician Assistant

## 2019-04-28 ENCOUNTER — Encounter: Payer: Self-pay | Admitting: Emergency Medicine

## 2019-04-28 DIAGNOSIS — J029 Acute pharyngitis, unspecified: Secondary | ICD-10-CM | POA: Insufficient documentation

## 2019-04-28 LAB — POCT RAPID STREP A (OFFICE): Rapid Strep A Screen: NEGATIVE

## 2019-04-28 NOTE — ED Notes (Signed)
Patient able to ambulate independently  

## 2019-04-28 NOTE — ED Triage Notes (Signed)
Pt presents to [facility] for assessment of sore throat x 1 week.  States it does not feel like a normal "raspy" sore throat.  Patient states it feels like someone hit her in the neck when she swallows.

## 2019-04-28 NOTE — ED Provider Notes (Signed)
[facility]    CSN: [id number] Arrival date & time: 04/28/19  1753      History   Chief Complaint Chief Complaint  Patient presents with  . Sore Throat    HPI Cheryl Spencer is a 38 y.o. female.   The history is provided by the patient. No language interpreter was used.  Sore Throat This is a new problem. The current episode started more than 1 week ago. The problem occurs constantly. The problem has not changed since onset.Nothing aggravates the symptoms. Nothing relieves the symptoms. She has tried nothing for the symptoms. The treatment provided no relief.   Pt complains of a sore throat for over a week   Pt had a negative covid test 3 days ago.  History reviewed. No pertinent past medical history.  There are no active problems to display for this patient.   Past Surgical History:  Procedure Laterality Date  . CESAREAN SECTION    . DILATION AND CURETTAGE OF UTERUS    . LAPAROSCOPY      OB History   No obstetric history on file.      Home Medications    Prior to Admission medications   Medication Sig Start Date End Date Taking? Authorizing Provider  cyclobenzaprine (FLEXERIL) 10 MG tablet Take 1 tablet (10 mg total) by mouth 2 (two) times daily as needed for muscle spasms. 07/25/18   Zadie RhineWickline, Donald, MD    Family History Family History  Problem Relation Age of Onset  . Heart attack Father   . Asthma Brother     Social History Social History   Tobacco Use  . Smoking status: Never Smoker  . Smokeless tobacco: Never Used  Substance Use Topics  . Alcohol use: No  . Drug use: No     Allergies   Patient has no known allergies.   Review of Systems Review of Systems  All other systems reviewed and are negative.    Physical Exam Triage Vital Signs ED Triage Vitals  Enc Vitals Group     BP 04/28/19 1804 132/86     Pulse Rate 04/28/19 1804 98     Resp 04/28/19 1804 16     Temp 04/28/19 1804 98.9 F (37.2 C)     Temp Source  04/28/19 1804 Oral     SpO2 04/28/19 1804 98 %     Weight --      Height --      Head Circumference --      Peak Flow --      Pain Score 04/28/19 1806 6     Pain Loc --      Pain Edu? --      Excl. in GC? --    No data found.  Updated Vital Signs BP 132/86 (BP Location: Left Arm)   Pulse 98   Temp 98.9 F (37.2 C) (Oral)   Resp 16   LMP 04/12/2019   SpO2 98%   Visual Acuity Right Eye Distance:   Left Eye Distance:   Bilateral Distance:    Right Eye Near:   Left Eye Near:    Bilateral Near:     Physical Exam Vitals signs and nursing note reviewed.  Constitutional:      Appearance: She is well-developed.  HENT:     Head: Normocephalic.     Right Ear: Tympanic membrane normal.     Mouth/Throat:     Tonsils: 1+ on the right. 1+ on the left.  Eyes:  Conjunctiva/sclera: Conjunctivae normal.  Neck:     Musculoskeletal: Normal range of motion.  Cardiovascular:     Rate and Rhythm: Normal rate.  Pulmonary:     Effort: Pulmonary effort is normal.  Abdominal:     General: There is no distension.  Musculoskeletal: Normal range of motion.  Skin:    General: Skin is warm.  Neurological:     General: No focal deficit present.     Mental Status: She is alert and oriented to person, place, and time.      UC Treatments / Results  Labs (all labs ordered are listed, but only abnormal results are displayed) Labs Reviewed  POCT RAPID STREP A (OFFICE) - Normal  CULTURE, GROUP A STREP [facility])    EKG None  Radiology No results found.  Procedures Procedures (including critical care time)  Medications Ordered in UC Medications - No data to display  Initial Impression / Assessment and Plan / UC Course  I have reviewed the triage vital signs and the nursing notes.  Pertinent labs & imaging results that were available during my care of the patient were reviewed by me and considered in my medical decision making (see chart for details).     Strep negative,  I  suspect allergies,  No sign of infection  Final Clinical Impressions(s) / UC Diagnoses   Final diagnoses:  Acute pharyngitis, unspecified etiology     Discharge Instructions     Try taking zyrtec for symptoms.  Ibuprofen for pain    ED Prescriptions    None     Controlled Substance Prescriptions [place] Controlled Substance Registry consulted? Not Applicable  An After Visit Summary was printed and given to the patient.    Fransico Meadow, [place] 04/28/19 1851

## 2019-04-28 NOTE — Discharge Instructions (Addendum)
Try taking zyrtec for symptoms.  Ibuprofen for pain

## 2019-05-02 LAB — CULTURE, GROUP A STREP (THRC)

## 2019-05-05 ENCOUNTER — Telehealth (HOSPITAL_COMMUNITY): Payer: Self-pay | Admitting: Emergency Medicine

## 2019-05-05 NOTE — Telephone Encounter (Signed)
Contacted patient, pt states she feels 100% better. No treatment needed.

## 2019-11-12 ENCOUNTER — Other Ambulatory Visit: Payer: PRIVATE HEALTH INSURANCE

## 2023-02-23 ENCOUNTER — Other Ambulatory Visit: Payer: Self-pay | Admitting: Family Medicine

## 2023-02-23 DIAGNOSIS — G43109 Migraine with aura, not intractable, without status migrainosus: Secondary | ICD-10-CM

## 2024-04-08 ENCOUNTER — Encounter: Payer: Self-pay | Admitting: Emergency Medicine
